# Patient Record
Sex: Female | Born: 1955
Health system: Southern US, Community
[De-identification: ages and names within clinical notes are randomized; demographics above are authoritative.]

## PROBLEM LIST (undated history)

## (undated) DIAGNOSIS — J45909 Unspecified asthma, uncomplicated: Secondary | ICD-10-CM

## (undated) DIAGNOSIS — K219 Gastro-esophageal reflux disease without esophagitis: Secondary | ICD-10-CM

## (undated) DIAGNOSIS — E041 Nontoxic single thyroid nodule: Secondary | ICD-10-CM

## (undated) DIAGNOSIS — I10 Essential (primary) hypertension: Secondary | ICD-10-CM

## (undated) DIAGNOSIS — K635 Polyp of colon: Secondary | ICD-10-CM

## (undated) HISTORY — PX: ABDOMINAL HYSTERECTOMY: SHX81

## (undated) HISTORY — DX: Gastro-esophageal reflux disease without esophagitis: K21.9

## (undated) HISTORY — PX: THYROIDECTOMY, PARTIAL: SHX18

## (undated) HISTORY — DX: Nontoxic single thyroid nodule: E04.1

## (undated) HISTORY — DX: Polyp of colon: K63.5

## (undated) HISTORY — DX: Unspecified asthma, uncomplicated: J45.909

## (undated) HISTORY — DX: Essential (primary) hypertension: I10

---

## 2013-10-22 ENCOUNTER — Ambulatory Visit (INDEPENDENT_AMBULATORY_CARE_PROVIDER_SITE_OTHER): Payer: BC Managed Care – PPO | Admitting: Family Medicine

## 2013-10-22 VITALS — BP 148/90 | HR 71 | Temp 98.3°F | Resp 18

## 2013-10-22 DIAGNOSIS — R0789 Other chest pain: Secondary | ICD-10-CM

## 2013-10-22 DIAGNOSIS — K625 Hemorrhage of anus and rectum: Secondary | ICD-10-CM

## 2013-10-22 DIAGNOSIS — R42 Dizziness and giddiness: Secondary | ICD-10-CM

## 2013-10-22 LAB — CBC WITH DIFFERENTIAL/PLATELET
Basophils Absolute: 0 10*3/uL (ref 0.0–0.1)
Basophils Relative: 1 % (ref 0–1)
Eosinophils Absolute: 0.1 10*3/uL (ref 0.0–0.7)
Eosinophils Relative: 2 % (ref 0–5)
HCT: 39.3 % (ref 36.0–46.0)
Hemoglobin: 13.2 g/dL (ref 12.0–15.0)
Lymphocytes Relative: 48 % — ABNORMAL HIGH (ref 12–46)
Lymphs Abs: 1.9 10*3/uL (ref 0.7–4.0)
MCH: 28.4 pg (ref 26.0–34.0)
MCHC: 33.6 g/dL (ref 30.0–36.0)
MCV: 84.5 fL (ref 78.0–100.0)
Monocytes Absolute: 0.3 10*3/uL (ref 0.1–1.0)
Monocytes Relative: 8 % (ref 3–12)
Neutro Abs: 1.6 10*3/uL — ABNORMAL LOW (ref 1.7–7.7)
Neutrophils Relative %: 41 % — ABNORMAL LOW (ref 43–77)
Platelets: 251 10*3/uL (ref 150–400)
RBC: 4.65 MIL/uL (ref 3.87–5.11)
RDW: 13.9 % (ref 11.5–15.5)
WBC: 3.9 10*3/uL — ABNORMAL LOW (ref 4.0–10.5)

## 2013-10-22 LAB — COMPREHENSIVE METABOLIC PANEL
ALT: 32 U/L (ref 0–35)
AST: 25 U/L (ref 0–37)
Albumin: 4.6 g/dL (ref 3.5–5.2)
Alkaline Phosphatase: 85 U/L (ref 39–117)
BUN: 14 mg/dL (ref 6–23)
CO2: 27 mEq/L (ref 19–32)
Calcium: 9.9 mg/dL (ref 8.4–10.5)
Chloride: 105 mEq/L (ref 96–112)
Creat: 0.86 mg/dL (ref 0.50–1.10)
Glucose, Bld: 100 mg/dL — ABNORMAL HIGH (ref 70–99)
Potassium: 4.3 mEq/L (ref 3.5–5.3)
Sodium: 141 mEq/L (ref 135–145)
Total Bilirubin: 0.7 mg/dL (ref 0.2–1.2)
Total Protein: 7.9 g/dL (ref 6.0–8.3)

## 2013-10-22 MED ORDER — MECLIZINE HCL 25 MG PO TABS: 25.0000 mg | ORAL_TABLET | Freq: Three times a day (TID) | ORAL | Status: DC | PRN

## 2013-10-22 NOTE — Patient Instructions (Signed)
Please use the meclizine as directed for dizziness- this may make you sleepy We will call you within the next day or two with you lab results We will call you with an appointment with a cardiologist If you feel worse, please return or go to the emergency room  Benign Positional Vertigo Vertigo means you feel like you or your surroundings are moving when they are not. Benign positional vertigo is the most common form of vertigo. Benign means that the cause of your condition is not serious. Benign positional vertigo is more common in older adults. CAUSES  Benign positional vertigo is the result of an upset in the labyrinth system. This is an area in the middle ear that helps control your balance. This may be caused by a viral infection, head injury, or repetitive motion. However, often no specific cause is found. SYMPTOMS  Symptoms of benign positional vertigo occur when you move your head or eyes in different directions. Some of the symptoms may include:  Loss of balance and falls.  Vomiting.  Blurred vision.  Dizziness.  Nausea.  Involuntary eye movements (nystagmus). DIAGNOSIS  Benign positional vertigo is usually diagnosed by physical exam. If the specific cause of your benign positional vertigo is unknown, your caregiver may perform imaging tests, such as magnetic resonance imaging (MRI) or computed tomography (CT). TREATMENT  Your caregiver may recommend movements or procedures to correct the benign positional vertigo. Medicines such as meclizine, benzodiazepines, and medicines for nausea may be used to treat your symptoms. In rare cases, if your symptoms are caused by certain conditions that affect the inner ear, you may need surgery. HOME CARE INSTRUCTIONS   Follow your caregiver's instructions.  Move slowly. Do not make sudden body or head movements.  Avoid driving.  Avoid operating heavy machinery.  Avoid performing any tasks that would be dangerous to you or others  during a vertigo episode.  Drink enough fluids to keep your urine clear or pale yellow. SEEK IMMEDIATE MEDICAL CARE IF:   You develop problems with walking, weakness, numbness, or using your arms, hands, or legs.  You have difficulty speaking.  You develop severe headaches.  Your nausea or vomiting continues or gets worse.  You develop visual changes.  Your family or friends notice any behavioral changes.  Your condition gets worse.  You have a fever.  You develop a stiff neck or sensitivity to light. MAKE SURE YOU:   Understand these instructions.  Will watch your condition.  Will get help right away if you are not doing well or get worse. Document Released: 11/30/2005 Document Revised: 05/17/2011 Document Reviewed: 11/12/2010 [facility] Patient Information 2015 [place]. This information is not intended to replace advice given to you by your health care provider. Make sure you discuss any questions you have with your health care provider.

## 2013-10-22 NOTE — Progress Notes (Signed)
Subjective:    Patient ID: Kerri Johnson, female    DOB: Nov 05, 1955, 58 y.o.   MRN: [id number]  HPI This is a very pleasant 58 yo female who [place] [facility] [place] husband's [place]. Her husband is with her this morning. Patient reports that she has been feeling dizzy for about a week. She is also feeling sweaty with the dizziness. She feels like her heart is thumping this morning. She is having "tiny pings" of pain under her left breast. She has had this pain in the past. Was seen in the ED a couple of months ago for this pain; had a "battery of tests," and was treated for GERD. She also is having numbness in left 2nd and 3rd fingers. She was previously diagnosed with Raynaud's phenomenon. Yesterday, her blood pressure was 148/96. [facility] was [facility] [employer] [facility] and she felt really dizzy and was sweating profusely. She continued to be dizzy all night, worsening when turning her head to the right side, less so when turning to the left. No spinning sensation, feels like she is going to faint. Was diagnosed with benign positional vertigo in the past and has been doing the exercises without significant relief.  She had a negative cardiac cath about 4 years ago. Had a stress test about 3 years ago that was normal. Took lisinopril for awhile, but was unable to tolerate. Her high blood pressure was felt to be related to headaches. She has not since had high blood pressure as long as she controls her headaches.   She reports she has early onset COPD. She has never smoked.  Has occasional GERD. Has lost over 20 pounds by cutting out added sugar.   She has a history of colon polyps. She had a large polyp removed last year. Her mother had a history of multiple polyps. She reports that she has had 2 episodes of blood on the toilet paper when wiping after walking on the treadmill. It only happened following exercise.   She also reports having a small bump on the back of  her head that has been there for 102 months and has gotten progressively larger.  Past Medical History  Diagnosis Date  . Asthma    Past Surgical History  Procedure Laterality Date  . Cesarean section    . Abdominal hysterectomy    . Thyroidectomy, partial     Family History  Problem Relation Age of Onset  . Diabetes Mother   . Heart disease Mother   . Hypertension Mother   . Diabetes Sister   . Hypertension Sister   . Diabetes Sister   - Her mother died from CHF in her 7770's. No known MI, had history of HTN, DM.  History  Substance Use Topics  . Smoking status: Never Smoker   . Smokeless tobacco: Not on file  . Alcohol Use: No     Review of Systems No SOB, no fever, a little nausea several days ago, no runny nose, no ear pain, no sore throat. No polyuria, no polydipsia, no polyphagia. No headache.    Objective:   Physical Exam  Vitals reviewed. Constitutional: She is oriented to person, place, and time. She appears well-developed and well-nourished.  HENT:  Head: Normocephalic and atraumatic.    Right Ear: Tympanic membrane, external ear and ear canal normal.  Left Ear: Tympanic membrane, external ear and ear canal normal.  Nose: Nose normal.  Mouth/Throat: Oropharynx is clear and moist.  Eyes: Conjunctivae and EOM are normal. Pupils are equal, round, and reactive to light. Right eye exhibits no discharge. Left eye exhibits no discharge. No scleral icterus.  Neck: Normal range of motion. Neck supple. No JVD present.  Cardiovascular: Normal rate, regular rhythm, normal heart sounds and intact distal pulses.   Pulmonary/Chest: Effort normal and breath sounds normal. She exhibits no tenderness.  Abdominal: Soft. Bowel sounds are normal.  Genitourinary: Rectum normal. Rectal exam shows no external hemorrhoid.  Musculoskeletal: Normal range of motion. She exhibits edema (trace pretibial edema).  Lymphadenopathy:    She has no cervical adenopathy.  Neurological: She is  alert and oriented to person, place, and time.  Skin: Skin is warm and dry.  Psychiatric: She has a normal mood and affect. Her behavior is normal. Judgment and thought content normal.   EKG reviewed with Dr. Katrinka Blazing- no acute abnormalities    Assessment & Plan:  1. Other chest pain - EKG 12-Lead - CBC with Differential - Comprehensive metabolic panel - Ambulatory referral to Cardiology  2. Dizziness - CBC with Differential - Comprehensive metabolic panel - meclizine (ANTIVERT) 25 MG tablet; Take 1 tablet (25 mg total) by mouth 3 (three) times daily as needed for dizziness.  Dispense: 30 tablet; Refill: 0  3. Rectal bleeding -these seem like isolated episodes- with patient's history or large polyp removed and unknown colonoscopy follow up will schedule with GI. - Ambulatory referral to Gastroenterology  -Patient was instructed to RTC or go to ER if worsening symptoms   Emi Belfast, FNP-BC  [facility], [facility]  10/22/2013 1:00 PM

## 2013-10-24 ENCOUNTER — Encounter: Payer: Self-pay | Admitting: Internal Medicine

## 2013-11-03 NOTE — Progress Notes (Signed)
History and physical examinations reviewed in detail. EKG reviewed.  Agree with assessment and plan.

## 2013-11-16 ENCOUNTER — Encounter: Payer: Self-pay | Admitting: Internal Medicine

## 2013-11-16 ENCOUNTER — Other Ambulatory Visit (INDEPENDENT_AMBULATORY_CARE_PROVIDER_SITE_OTHER): Payer: BC Managed Care – PPO

## 2013-11-16 ENCOUNTER — Ambulatory Visit (INDEPENDENT_AMBULATORY_CARE_PROVIDER_SITE_OTHER): Payer: BC Managed Care – PPO | Admitting: Internal Medicine

## 2013-11-16 VITALS — BP 114/78 | HR 64 | Wt 222.8 lb

## 2013-11-16 DIAGNOSIS — K625 Hemorrhage of anus and rectum: Secondary | ICD-10-CM

## 2013-11-16 DIAGNOSIS — K219 Gastro-esophageal reflux disease without esophagitis: Secondary | ICD-10-CM

## 2013-11-16 DIAGNOSIS — Z860100 Personal history of colon polyps, unspecified: Secondary | ICD-10-CM

## 2013-11-16 DIAGNOSIS — I10 Essential (primary) hypertension: Secondary | ICD-10-CM | POA: Insufficient documentation

## 2013-11-16 LAB — CBC WITH DIFFERENTIAL/PLATELET
Basophils Absolute: 0.1 10*3/uL (ref 0.0–0.1)
Basophils Relative: 1.2 % (ref 0.0–3.0)
Eosinophils Absolute: 0.1 10*3/uL (ref 0.0–0.7)
Eosinophils Relative: 1.2 % (ref 0.0–5.0)
HCT: 41.9 % (ref 36.0–46.0)
Hemoglobin: 13.7 g/dL (ref 12.0–15.0)
Lymphocytes Relative: 45.9 % (ref 12.0–46.0)
Lymphs Abs: 2.3 10*3/uL (ref 0.7–4.0)
MCHC: 32.7 g/dL (ref 30.0–36.0)
MCV: 88.1 fl (ref 78.0–100.0)
Monocytes Absolute: 0.4 10*3/uL (ref 0.1–1.0)
Monocytes Relative: 8.4 % (ref 3.0–12.0)
Neutro Abs: 2.2 10*3/uL (ref 1.4–7.7)
Neutrophils Relative %: 43.3 % (ref 43.0–77.0)
Platelets: 225 10*3/uL (ref 150.0–400.0)
RBC: 4.76 Mil/uL (ref 3.87–5.11)
RDW: 13.5 % (ref 11.5–15.5)
WBC: 5 10*3/uL (ref 4.0–10.5)

## 2013-11-16 MED ORDER — HYDROCORTISONE ACETATE 25 MG RE SUPP: 25.0000 mg | Freq: Two times a day (BID) | RECTAL | Status: DC

## 2013-11-16 MED ORDER — MOVIPREP 100 G PO SOLR: 1.0000 | Freq: Once | ORAL | Status: DC

## 2013-11-16 NOTE — Patient Instructions (Signed)
You have been scheduled for a colonoscopy. Please follow written instructions given to you at your visit today.  Please pick up your prep kit at the pharmacy within the next 1-3 days. If you use inhalers (even only as needed), please bring them with you on the day of your procedure. Your physician has requested that you go to www.startemmi.com and enter the access code given to you at your visit today. This web site gives a general overview about your procedure. However, you should still follow specific instructions given to you by our office regarding your preparation for the procedure.  Your physician has requested that you go to the basement for the following lab work before leaving today:  CBC  We have sent the following medications to your pharmacy for you to pick up at your convenience: Anusol [facility] Suppositories  Continue your Omeprazole daily

## 2013-11-16 NOTE — Progress Notes (Signed)
Patient ID: Kerri Johnson, female   DOB: 08-14-1955, 58 y.o.   MRN: [id number] HPI: Kerri Johnson is a 58 year old female with past medical history of adenomatous colon polyps, hypertension, GERD and mild asthma who is seen in consultation at the request of Silva Bandy, FNP to evaluate rectal bleeding. She is here alone today. She reports that she moved to [place] from [facility] [place] months ago. She's also recently started exercising. Approximately 10 days ago after what she considers vigorous exercise, she reports bright red rectal bleeding. This was not associated with bowel movement. It was associated with rectal discomfort and burning. It occurred 2 days later again after exercise and again not associated with bowel movement. Intervening bowel movements have been unremarkable without blood or melena. Her bowel movements have always been erratic and can be hard and at other times loose. She denies abdominal pain. No tenesmus.  She has a history of colon polyps and her last colonoscopy was 18 months ago performed [facility]. Reportedly she had a large right-sided colon polyp removed which she was told was precancerous. Per her recollection repeat colonoscopy was indicated in one year and she feels she is due at this time for repeat surveillance colonoscopy. She does have a history of reflux disease particular heartburn. She has been trying to lose weight in order to help with her reflux. She's lost about 12 pounds intentionally by drastically reducing sugar intake and also exercise. She denies dysphagia or odynophagia. Neuro satiety, nausea or vomiting. No hepatobiliary complaints.  Past Medical History  Diagnosis Date  . Asthma   . GERD (gastroesophageal reflux disease)   . Colon polyps   . Hypertension     Past Surgical History  Procedure Laterality Date  . Cesarean section    . Abdominal hysterectomy    . Thyroidectomy, partial      Outpatient  Prescriptions Prior to Visit  Medication Sig Dispense Refill  . albuterol (PROVENTIL HFA;VENTOLIN HFA) 108 (90 BASE) MCG/ACT inhaler Inhale into the lungs every 6 (six) hours as needed for wheezing or shortness of breath.      . meclizine (ANTIVERT) 25 MG tablet Take 1 tablet (25 mg total) by mouth 3 (three) times daily as needed for dizziness.  30 tablet  0  . tiotropium (SPIRIVA) 18 MCG inhalation capsule Place 18 mcg into inhaler and inhale daily.       No facility-administered medications prior to visit.    Allergies  Allergen Reactions  . Peanuts [Peanut Oil]   . Shellfish Allergy   . Versed [Midazolam]     Family History  Problem Relation Age of Onset  . Diabetes Mother   . Heart disease Mother   . Hypertension Mother   . Diabetes Sister   . Hypertension Sister   . Diabetes Sister     History  Substance Use Topics  . Smoking status: Never Smoker   . Smokeless tobacco: Never Used  . Alcohol Use: No    ROS: As per history of present illness, otherwise negative  BP 114/78  Pulse 64  Wt 222 lb 12.8 oz (101.061 kg) Constitutional: Well-developed and well-nourished. No distress. HEENT: Normocephalic and atraumatic. Oropharynx is clear and moist. No oropharyngeal exudate. Conjunctivae are normal.  No scleral icterus. Neck: Neck supple. Trachea midline. Cardiovascular: Normal rate, regular rhythm and intact distal pulses. No M/R/G Pulmonary/chest: Effort normal and breath sounds normal. No wheezing, rales or rhonchi. Abdominal: Soft, nontender, nondistended. Bowel sounds active throughout. There are  no masses palpable. No hepatosplenomegaly. Extremities: no clubbing, cyanosis, or edema Neurological: Alert and oriented to person place and time. Skin: Skin is warm and dry. No rashes noted. Psychiatric: Normal mood and affect. Behavior is normal.  RELEVANT LABS AND IMAGING: CBC    Component Value Date/Time   WBC 3.9* 10/22/2013 0958   RBC 4.65 10/22/2013 0958   HGB  13.2 10/22/2013 0958   HCT 39.3 10/22/2013 0958   PLT 251 10/22/2013 0958   MCV 84.5 10/22/2013 0958   MCH 28.4 10/22/2013 0958   MCHC 33.6 10/22/2013 0958   RDW 13.9 10/22/2013 0958   LYMPHSABS 1.9 10/22/2013 0958   MONOABS 0.3 10/22/2013 0958   EOSABS 0.1 10/22/2013 0958   BASOSABS 0.0 10/22/2013 0958    CMP     Component Value Date/Time   NA 141 10/22/2013 0958   K 4.3 10/22/2013 0958   CL 105 10/22/2013 0958   CO2 27 10/22/2013 0958   GLUCOSE 100* 10/22/2013 0958   BUN 14 10/22/2013 0958   CREATININE 0.86 10/22/2013 0958   CALCIUM 9.9 10/22/2013 0958   PROT 7.9 10/22/2013 0958   ALBUMIN 4.6 10/22/2013 0958   AST 25 10/22/2013 0958   ALT 32 10/22/2013 0958   ALKPHOS 85 10/22/2013 0958   BILITOT 0.7 10/22/2013 0958    ASSESSMENT/PLAN:  58 year old female with past medical history of adenomatous colon polyps, hypertension, GERD and mild asthma who is seen in consultation at the request of Silva Bandy, FNP to evaluate rectal bleeding.  1. Rectal bleeding -- her rectal bleeding occurred 2 times after exercise, raising the possibility of hemorrhoidal bleeding. She has not felt a bulge or pain with wiping making internal hemorrhoids more likely than external. I will treat with hydrocortisone suppository 25 mg twice daily for 5 days for internal hemorrhoids. She is also due for colonoscopy which will allow for further evaluation of recent bleeding and possibly hemorrhoids, see #2. Repeat CBC today for comparison given recent bleeding. Last CBC 10/22/2013 revealed hemoglobin of 13.2 (normal).  2. History of adenomatous colon polyp -- she was told to repeat colonoscopy in one year which suggests piecemeal resection or large polyp. We will request records today for review prior to colonoscopy. Colonoscopy was discussed including the risks and benefits and she is agreeable to proceed.  3. GERD -- GERD diet, further weight loss will likely improve symptoms. For now she will take omeprazole 20 mg a more  regular, daily basis to help control symptoms. Her symptoms of heartburn and been mild without alarm symptoms.  Further recommendations after review of records an upcoming colonoscopy

## 2013-11-19 ENCOUNTER — Ambulatory Visit (INDEPENDENT_AMBULATORY_CARE_PROVIDER_SITE_OTHER): Payer: BC Managed Care – PPO | Admitting: Family Medicine

## 2013-11-19 VITALS — BP 132/80 | HR 95 | Temp 98.9°F | Resp 18 | Ht 66.0 in | Wt 221.0 lb

## 2013-11-19 DIAGNOSIS — B349 Viral infection, unspecified: Secondary | ICD-10-CM

## 2013-11-19 DIAGNOSIS — B9789 Other viral agents as the cause of diseases classified elsewhere: Secondary | ICD-10-CM

## 2013-11-19 DIAGNOSIS — J029 Acute pharyngitis, unspecified: Secondary | ICD-10-CM

## 2013-11-19 LAB — POCT RAPID STREP A (OFFICE): Rapid Strep A Screen: NEGATIVE

## 2013-11-19 MED ORDER — FIRST-DUKES MOUTHWASH MT SUSP: 5.0000 mL | OROMUCOSAL | Status: DC | PRN

## 2013-11-19 NOTE — Progress Notes (Signed)
   Subjective:    Patient ID: Kerri Johnson, female    DOB: 1955/12/23, 58 y.o.   MRN: [id number]  HPI Patient presents today with 4 day history of weakness, headaches, sore throat. Has been taking advil 2 tablets every 4 hours with good relief. Had fever last night- subjective, didn't take.  No asthma symptoms. No sick contacts.   Review of Systems Fever, body aches, weakness, occasional cough, right ear ache, No wheeze, no SOB.    Objective:   Physical Exam  Constitutional: She is oriented to person, place, and time. She appears well-developed and well-nourished. She appears ill.  HENT:  Head: Normocephalic and atraumatic.  Right Ear: Tympanic membrane, external ear and ear canal normal.  Left Ear: Tympanic membrane, external ear and ear canal normal.  Nose: Nose normal.  Mouth/Throat: Uvula is midline and mucous membranes are normal. Posterior oropharyngeal erythema present. No oropharyngeal exudate, posterior oropharyngeal edema or tonsillar abscesses.  Eyes: Conjunctivae are normal. Pupils are equal, round, and reactive to light.  Neck: Normal range of motion. Neck supple.  Cardiovascular: Normal rate and regular rhythm.   Pulmonary/Chest: Effort normal and breath sounds normal.  Neurological: She is alert and oriented to person, place, and time.  Skin: Skin is warm and dry.  Psychiatric: She has a normal mood and affect. Her behavior is normal. Judgment and thought content normal.   Rapid strep- negative     Assessment & Plan:  1. Pharyngitis - POCT rapid strep A- negative - Culture, Group A Strep  2. Viral illness -Provided written and verbal information regarding diagnosis and treatment. -symptomatic treatment, RTC if no improvement in 3-4 days, sooner if worseing.   Emi Belfast, FNP-BC  [facility], [facility]  11/20/2013 9:57 PM

## 2013-11-19 NOTE — Patient Instructions (Signed)
Try to take ibuprofen 3 tablets every 6-8 hours Increase fluids I have sent a numbing medication to your pharmacy, you can also try warm salt water gargles and warm beverages for comfort. Please come back in if you are feeling worse in the next couple of days or if you are not improved in 3-4 days.   Pharyngitis Pharyngitis is redness, pain, and swelling (inflammation) of your pharynx.  CAUSES  Pharyngitis is usually caused by infection. Most of the time, these infections are from viruses (viral) and are part of a cold. However, sometimes pharyngitis is caused by bacteria (bacterial). Pharyngitis can also be caused by allergies. Viral pharyngitis may be spread from person to person by coughing, sneezing, and personal items or utensils (cups, forks, spoons, toothbrushes). Bacterial pharyngitis may be spread from person to person by more intimate contact, such as kissing.  SIGNS AND SYMPTOMS  Symptoms of pharyngitis include:   Sore throat.   Tiredness (fatigue).   Low-grade fever.   Headache.  Joint pain and muscle aches.  Skin rashes.  Swollen lymph nodes.  Plaque-like film on throat or tonsils (often seen with bacterial pharyngitis). DIAGNOSIS  Your health care provider will ask you questions about your illness and your symptoms. Your medical history, along with a physical exam, is often all that is needed to diagnose pharyngitis. Sometimes, a rapid strep test is done. Other lab tests may also be done, depending on the suspected cause.  TREATMENT  Viral pharyngitis will usually get better in 3-4 days without the use of medicine. Bacterial pharyngitis is treated with medicines that kill germs (antibiotics).  HOME CARE INSTRUCTIONS   Drink enough water and fluids to keep your urine clear or pale yellow.   Only take over-the-counter or prescription medicines as directed by your health care provider:   If you are prescribed antibiotics, make sure you finish them even if you  start to feel better.   Do not take aspirin.   Get lots of rest.   Gargle with 8 oz of salt water ( tsp of salt per 1 qt of water) as often as every 1-2 hours to soothe your throat.   Throat lozenges (if you are not at risk for choking) or sprays may be used to soothe your throat. SEEK MEDICAL CARE IF:   You have large, tender lumps in your neck.  You have a rash.  You cough up green, yellow-brown, or bloody spit. SEEK IMMEDIATE MEDICAL CARE IF:   Your neck becomes stiff.  You drool or are unable to swallow liquids.  You vomit or are unable to keep medicines or liquids down.  You have severe pain that does not go away with the use of recommended medicines.  You have trouble breathing (not caused by a stuffy nose). MAKE SURE YOU:   Understand these instructions.  Will watch your condition.  Will get help right away if you are not doing well or get worse. Document Released: 02/22/2005 Document Revised: 12/13/2012 Document Reviewed: 10/30/2012 [facility] Patient Information 2015 [place], [place]. This information is not intended to replace advice given to you by your health care provider. Make sure you discuss any questions you have with your health care provider.

## 2013-11-20 LAB — CULTURE, GROUP A STREP

## 2013-11-21 ENCOUNTER — Telehealth: Payer: Self-pay

## 2013-11-21 MED ORDER — AMOXICILLIN 875 MG PO TABS: 875.0000 mg | ORAL_TABLET | Freq: Two times a day (BID) | ORAL | Status: AC

## 2013-11-21 NOTE — Telephone Encounter (Signed)
Rx sent.  Meds ordered this encounter  Medications  . amoxicillin (AMOXIL) 875 MG tablet    Sig: Take 1 tablet (875 mg total) by mouth 2 (two) times daily.    Dispense:  20 tablet    Refill:  0    Order Specific Question:  Supervising Provider    Answer:  DOOLITTLE, ROBERT P [[id number]]

## 2013-11-21 NOTE — Telephone Encounter (Signed)
Pt called back re: lab result call she received. Reports that she is still not feeling well, in fact her throat is now sore on both sides. She would like an Abx sent in as soon as possible so she can get started on it. See lab results (pos for non strep A).

## 2013-11-23 ENCOUNTER — Encounter: Payer: Self-pay | Admitting: Family Medicine

## 2013-11-23 ENCOUNTER — Ambulatory Visit (INDEPENDENT_AMBULATORY_CARE_PROVIDER_SITE_OTHER): Payer: BC Managed Care – PPO | Admitting: Family Medicine

## 2013-11-23 VITALS — BP 148/94 | HR 90 | Temp 98.2°F | Resp 16 | Ht 65.5 in | Wt 219.2 lb

## 2013-11-23 DIAGNOSIS — Z13220 Encounter for screening for lipoid disorders: Secondary | ICD-10-CM

## 2013-11-23 DIAGNOSIS — Z0000 Encounter for general adult medical examination without abnormal findings: Secondary | ICD-10-CM

## 2013-11-23 DIAGNOSIS — Z130 Encounter for screening for diseases of the blood and blood-forming organs and certain disorders involving the immune mechanism: Secondary | ICD-10-CM

## 2013-11-23 DIAGNOSIS — Z8742 Personal history of other diseases of the female genital tract: Secondary | ICD-10-CM

## 2013-11-23 DIAGNOSIS — E890 Postprocedural hypothyroidism: Secondary | ICD-10-CM

## 2013-11-23 DIAGNOSIS — Z9889 Other specified postprocedural states: Secondary | ICD-10-CM

## 2013-11-23 DIAGNOSIS — Z8639 Personal history of other endocrine, nutritional and metabolic disease: Secondary | ICD-10-CM

## 2013-11-23 DIAGNOSIS — Z1239 Encounter for other screening for malignant neoplasm of breast: Secondary | ICD-10-CM

## 2013-11-23 DIAGNOSIS — Z9009 Acquired absence of other part of head and neck: Secondary | ICD-10-CM

## 2013-11-23 DIAGNOSIS — K219 Gastro-esophageal reflux disease without esophagitis: Secondary | ICD-10-CM

## 2013-11-23 LAB — CBC
HCT: 37.5 % (ref 36.0–46.0)
Hemoglobin: 12.6 g/dL (ref 12.0–15.0)
MCH: 28 pg (ref 26.0–34.0)
MCHC: 33.6 g/dL (ref 30.0–36.0)
MCV: 83.3 fL (ref 78.0–100.0)
Platelets: 259 10*3/uL (ref 150–400)
RBC: 4.5 MIL/uL (ref 3.87–5.11)
RDW: 13.7 % (ref 11.5–15.5)
WBC: 4.8 10*3/uL (ref 4.0–10.5)

## 2013-11-23 LAB — LIPID PANEL
Cholesterol: 150 mg/dL (ref 0–200)
HDL: 44 mg/dL (ref >39)
LDL Cholesterol: 94 mg/dL (ref 0–99)
Total CHOL/HDL Ratio: 3.4 Ratio
Triglycerides: 62 mg/dL (ref <150)
VLDL: 12 mg/dL (ref 0–40)

## 2013-11-23 LAB — COMPLETE METABOLIC PANEL WITH GFR
ALT: 66 U/L — ABNORMAL HIGH (ref 0–35)
AST: 26 U/L (ref 0–37)
Albumin: 4.2 g/dL (ref 3.5–5.2)
Alkaline Phosphatase: 109 U/L (ref 39–117)
BUN: 15 mg/dL (ref 6–23)
CO2: 27 mEq/L (ref 19–32)
Calcium: 9.6 mg/dL (ref 8.4–10.5)
Chloride: 105 mEq/L (ref 96–112)
Creat: 0.81 mg/dL (ref 0.50–1.10)
GFR, Est African American: >89 mL/min
GFR, Est Non African American: 81 mL/min
Glucose, Bld: 101 mg/dL — ABNORMAL HIGH (ref 70–99)
Potassium: 4.6 mEq/L (ref 3.5–5.3)
Sodium: 140 mEq/L (ref 135–145)
Total Bilirubin: 0.7 mg/dL (ref 0.2–1.2)
Total Protein: 7.5 g/dL (ref 6.0–8.3)

## 2013-11-23 LAB — TSH: TSH: 3.647 u[IU]/mL (ref 0.350–4.500)

## 2013-11-23 MED ORDER — OMEPRAZOLE 20 MG PO CPDR: 20.0000 mg | DELAYED_RELEASE_CAPSULE | Freq: Every day | ORAL | Status: DC

## 2013-11-23 NOTE — Progress Notes (Signed)
 Chief Complaint:  Chief Complaint  Patient presents with  . Establish Care  . Medication Reaction    possible reaction to antibiotic itching on hands and lip after second day on medicine    HPI: Kerri Johnson is a 58 y.o. female who is here for establlishing care She has had CPE on her birthday last year, she has had partial hysterectomy for benign fibroids She has "COPD" but not smoker She had a hysterctomy ( partial ) due to fibroids, benign path causing her to bleed heavily She is here from [place], recent transplant to [place] prior to that was living in [place] Last pap was last year PCP Dr Lilia Pro ( [place] [phone]) ,  Last mammogram is overdue She is scheduled for colonoscopy d/t recent rectal bleeding  She is also on amox for GBS pharyngitis and is not sure if she is having a reaction, she states it was after 2 days and she denies any SOB, CP, or throat swelling or rash, she just has some itching on the sides of her mouth She would like to continue with meds and if she ahs any worsening sxs then she will leet me know.   Past Medical History  Diagnosis Date  . Asthma   . GERD (gastroesophageal reflux disease)   . Colon polyps   . Hypertension   . Thyroid nodule     dx 2009   Past Surgical History  Procedure Laterality Date  . Cesarean section    . Abdominal hysterectomy    . Thyroidectomy, partial     History   Social History  . Marital Status: Married    Spouse Name: N/A    Number of Children: 3  . Years of Education: N/A   Social History Main Topics  . Smoking status: Never Smoker   . Smokeless tobacco: Never Used  . Alcohol Use: No  . Drug Use: No  . Sexual Activity: None   Other Topics Concern  . None   Social History Narrative  . None   Family History  Problem Relation Age of Onset  . Diabetes Mother   . Heart disease Mother   . Hypertension Mother   . Diabetes Sister   . Hypertension Sister   .  Diabetes Sister    Allergies  Allergen Reactions  . Shellfish Allergy   . Versed [Midazolam]    Prior to Admission medications   Medication Sig Start Date End Date Taking? Authorizing Provider  albuterol (PROVENTIL HFA;VENTOLIN HFA) 108 (90 BASE) MCG/ACT inhaler Inhale into the lungs every 6 (six) hours as needed for wheezing or shortness of breath.   Yes Historical Provider, MD  amoxicillin (AMOXIL) 875 MG tablet Take 1 tablet (875 mg total) by mouth 2 (two) times daily. 11/21/13 12/01/13 Yes Chelle S Jeffery, PA-C  meclizine (ANTIVERT) 25 MG tablet Take 1 tablet (25 mg total) by mouth 3 (three) times daily as needed for dizziness. 10/22/13  Yes Emi Belfast, FNP  tiotropium (SPIRIVA) 18 MCG inhalation capsule Place 18 mcg into inhaler and inhale daily.   Yes Historical Provider, MD  acetaminophen (TYLENOL) 325 MG tablet Take 650 mg by mouth every 6 (six) hours as needed.    Historical Provider, MD  Diphenhyd-Hydrocort-Nystatin (FIRST-DUKES MOUTHWASH) SUSP Use as directed 5 mLs in the mouth or throat every 2 (two) hours as needed. Drizzle down sides of throat, can swish and swallow or spit out. 11/19/13   Emi Belfast, FNP  hydrocortisone (ANUSOL-HC) 25 MG suppository Place 1 suppository (25 mg total) rectally 2 (two) times daily. 11/16/13   Beverley Fiedler, MD     ROS: The patient denies fevers, chills, night sweats, unintentional weight loss, chest pain, palpitations, wheezing, dyspnea on exertion, nausea, vomiting, abdominal pain, dysuria, hematuria, melena, numbness, weakness, or tingling.   All other systems have been reviewed and were otherwise negative with the exception of those mentioned in the HPI and as above.    PHYSICAL EXAM: Filed Vitals:   11/23/13 0910  BP: 148/94  Pulse: 90  Temp: 98.2 F (36.8 C)  Resp: 16   Filed Vitals:   11/23/13 0910  Height: 5' 5.5" (1.664 m)  Weight: 219 lb 3.2 oz (99.428 kg)   Body mass index is 35.91 kg/(m^2).  General: Alert, no  acute distress HEENT:  Normocephalic, atraumatic, oropharynx patent. EOMI, PERRLA. No obvious rash, throat is clear and no exudates. Tm normal, fundo exam normal Cardiovascular:  Regular rate and rhythm, no rubs murmurs or gallops.  No Carotid bruits, radial pulse intact. No pedal edema.  Respiratory: Clear to auscultation bilaterally.  No wheezes, rales, or rhonchi.  No cyanosis, no use of accessory musculature GI: No organomegaly, abdomen is soft and non-tender, positive bowel sounds.  No masses. Skin: No rashes. Neurologic: Facial musculature symmetric. Psychiatric: Patient is appropriate throughout our interaction. Lymphatic: No cervical lymphadenopathy Musculoskeletal: Gait intact. Breast exam normal Declined pelvic exam today   LABS: Results for orders placed in visit on 11/23/13  COMPLETE METABOLIC PANEL WITH GFR      Result Value Ref Range   Sodium 140  135 - 145 mEq/L   Potassium 4.6  3.5 - 5.3 mEq/L   Chloride 105  96 - 112 mEq/L   CO2 27  19 - 32 mEq/L   Glucose, Bld 101 (*) 70 - 99 mg/dL   BUN 15  6 - 23 mg/dL   Creat 1.61  0.96 - 0.45 mg/dL   Total Bilirubin 0.7  0.2 - 1.2 mg/dL   Alkaline Phosphatase 109  39 - 117 U/L   AST 26  0 - 37 U/L   ALT 66 (*) 0 - 35 U/L   Total Protein 7.5  6.0 - 8.3 g/dL   Albumin 4.2  3.5 - 5.2 g/dL   Calcium 9.6  8.4 - 40.9 mg/dL   GFR, Est African American >89     GFR, Est Non African American 81    CBC      Result Value Ref Range   WBC 4.8  4.0 - 10.5 K/uL   RBC 4.50  3.87 - 5.11 MIL/uL   Hemoglobin 12.6  12.0 - 15.0 g/dL   HCT 81.1  91.4 - 78.2 %   MCV 83.3  78.0 - 100.0 fL   MCH 28.0  26.0 - 34.0 pg   MCHC 33.6  30.0 - 36.0 g/dL   RDW 95.6  21.3 - 08.6 %   Platelets 259  150 - 400 K/uL  TSH      Result Value Ref Range   TSH 3.647  0.350 - 4.500 uIU/mL  LIPID PANEL      Result Value Ref Range   Cholesterol 150  0 - 200 mg/dL   Triglycerides 62  <578 mg/dL   HDL 44  >46 mg/dL   Total CHOL/HDL Ratio 3.4     VLDL 12  0 -  40 mg/dL   LDL Cholesterol 94  0 - 99 mg/dL  VITAMIN  D 25 HYDROXY      Result Value Ref Range   Vit D, 25-Hydroxy 27 (*) 30 - 89 ng/mL     EKG/XRAY:   Primary read interpreted by Dr. Conley Rolls at [facility].   ASSESSMENT/PLAN: Encounter Diagnoses  Name Primary?  . Screening for deficiency anemia   . Screening for breast cancer   . History of partial thyroidectomy   . Screening for hyperlipidemia   . Annual physical exam Yes  . Gastroesophageal reflux disease, esophagitis presence not specified   . H/O estrogen deficiency     Labs pending Will f/u after labs are completed She will be referred for mammogram, she will get her colonoscopy which is already scheduled Decline flu vaccine on this visit, she is going out of town and does not want to feel bad and she also is on amoxacillin for GBS pharyngitis, will monitor for sxs and call me if we need to put her on another abx F/u prn or in 1 year  Gross sideeffects, risk and benefits, and alternatives of medications d/w patient. Patient is aware that all medications have potential sideeffects and we are unable to predict every sideeffect or drug-drug interaction that may occur.  ,  PHUONG, DO 11/27/2013 5:35 AM

## 2013-11-23 NOTE — Telephone Encounter (Signed)
Left detailed msg on machine, call back with any questions

## 2013-11-24 LAB — VITAMIN D 25 HYDROXY (VIT D DEFICIENCY, FRACTURES): Vit D, 25-Hydroxy: 27 ng/mL — ABNORMAL LOW (ref 30–89)

## 2013-11-26 ENCOUNTER — Encounter: Payer: Self-pay | Admitting: Internal Medicine

## 2013-11-28 ENCOUNTER — Encounter: Payer: Self-pay | Admitting: Cardiology

## 2013-11-28 ENCOUNTER — Ambulatory Visit (INDEPENDENT_AMBULATORY_CARE_PROVIDER_SITE_OTHER): Payer: BC Managed Care – PPO | Admitting: Cardiology

## 2013-11-28 VITALS — BP 128/76 | HR 83 | Ht 66.0 in | Wt 219.0 lb

## 2013-11-28 DIAGNOSIS — R079 Chest pain, unspecified: Secondary | ICD-10-CM

## 2013-11-28 DIAGNOSIS — R9431 Abnormal electrocardiogram [ECG] [EKG]: Secondary | ICD-10-CM

## 2013-11-28 DIAGNOSIS — R0789 Other chest pain: Secondary | ICD-10-CM

## 2013-11-28 DIAGNOSIS — I10 Essential (primary) hypertension: Secondary | ICD-10-CM

## 2013-11-28 NOTE — Patient Instructions (Signed)
Your physician recommends that you continue on your current medications as directed. Please refer to the Current Medication list given to you today.  Your physician has requested that you have en exercise stress myoview. For further information please visit [email or web address]. Please follow instruction sheet, as given.  Follow up as needed  

## 2013-11-28 NOTE — Progress Notes (Signed)
Kerri Johnson Date of Birth:  1955/05/14 [facility] [address] [place], [place]  [address] [phone]        Fax   [phone]   History of Present Illness: This pleasant 58 year old [place] American woman is seen by me for the first time today at the request of Dr. Hamilton Capri.  She is being seen for evaluation of chest pain.  She has a past history of chest pain.  She recently moved to [place] from [facility] about 3 months ago.  Her husband is the Psychologist, prison and probation services of [employer].  About 3-4 weeks ago she had precordial chest pain associated with dizziness and went to urgent care where her evaluation was unremarkable.  She reports that in 2006 she had a prior cardiac catheterization in [place] and was told that there were no blockages.  She is a nonsmoker.  She has had prior history of high blood pressure but is not presently on any blood pressure medication.  She denies any knowledge of hypercholesterolemia.  She has a past history of migraine headaches.  She has a history of rectal bleeding and has an appointment to see GI for a colonoscopy in October.  She has been intentionally losing weight since last Halloween.  Over that period of time she has lost 20 pounds by avoiding sugars.  She has a history of asthma which she attributes to exposure to secondhand smoke as a child she has a history of a prior goiter with partial thyroidectomy.  She has a history of previous Raynaud's phenomenon. Her chest pain is precordial.  Occasionally she will have numbness in the hands.  She will have occasional sweats.  The discomfort is not temporally related to exercise.  Normally she walks several times a week for exercise without difficulty.  Current Outpatient Prescriptions  Medication Sig Dispense Refill  . albuterol (PROVENTIL HFA;VENTOLIN HFA) 108 (90 BASE) MCG/ACT inhaler Inhale into the lungs every 6 (six) hours as needed for wheezing or shortness of breath.        Marland Kitchen amoxicillin (AMOXIL) 875 MG tablet Take 1 tablet (875 mg total) by mouth 2 (two) times daily.  20 tablet  0  . meclizine (ANTIVERT) 25 MG tablet Take 1 tablet (25 mg total) by mouth 3 (three) times daily as needed for dizziness.  30 tablet  0  . omeprazole (PRILOSEC) 20 MG capsule Take 1 capsule (20 mg total) by mouth daily.  30 capsule  11  . tiotropium (SPIRIVA) 18 MCG inhalation capsule Place 18 mcg into inhaler and inhale daily.      Marland Kitchen acetaminophen (TYLENOL) 325 MG tablet Take 650 mg by mouth every 6 (six) hours as needed.      . Diphenhyd-Hydrocort-Nystatin (FIRST-DUKES MOUTHWASH) SUSP Use as directed 5 mLs in the mouth or throat every 2 (two) hours as needed. Drizzle down sides of throat, can swish and swallow or spit out.  237 mL  0  . hydrocortisone (ANUSOL-HC) 25 MG suppository Place 1 suppository (25 mg total) rectally 2 (two) times daily.  10 suppository  0   No current facility-administered medications for this visit.    Allergies  Allergen Reactions  . Shellfish Allergy   . Versed [Midazolam]     Patient Active Problem List   Diagnosis Date Noted  . Chest pain of uncertain etiology 11/28/2013  . Personal history of colonic polyps 11/16/2013  . GERD (gastroesophageal reflux disease) 11/16/2013  . HTN (hypertension) 11/16/2013  History  Smoking status  . Never Smoker   Smokeless tobacco  . Never Used    History  Alcohol Use No    Family History  Problem Relation Age of Onset  . Diabetes Mother   . Heart disease Mother   . Hypertension Mother   . Diabetes Sister   . Hypertension Sister   . Diabetes Sister     Review of Systems: Constitutional: no fever chills diaphoresis or fatigue or change in weight.  Head and neck: no hearing loss, no epistaxis, no photophobia or visual disturbance. Respiratory: No cough, shortness of breath or wheezing. Cardiovascular: No chest pain peripheral edema, palpitations. Gastrointestinal: No abdominal distention, no  abdominal pain, no change in bowel habits hematochezia or melena. Genitourinary: No dysuria, no frequency, no urgency, no nocturia. Musculoskeletal:No arthralgias, no back pain, no gait disturbance or myalgias. Neurological: No dizziness, no headaches, no numbness, no seizures, no syncope, no weakness, no tremors. Hematologic: No lymphadenopathy, no easy bruising. Psychiatric: No confusion, no hallucinations, no sleep disturbance.    Physical Exam: Filed Vitals:   11/28/13 0954  BP: 128/76  Pulse: 83  The patient appears to be in no distress.  Head and neck exam reveals that the pupils are equal and reactive.  The extraocular movements are full.  There is no scleral icterus.  Mouth and pharynx are benign.  No lymphadenopathy.  No carotid bruits.  The jugular venous pressure is normal.  Thyroid is not enlarged or tender.  Chest is clear to percussion and auscultation.  No rales or rhonchi.  Expansion of the chest is symmetrical.  Heart reveals no abnormal lift or heave.  First and second heart sounds are normal.  There is no murmur gallop rub or click.  The abdomen is soft and nontender.  Bowel sounds are normoactive.  There is no hepatosplenomegaly or mass.  There are no abdominal bruits.  Extremities reveal no phlebitis or edema.  Pedal pulses are good.  There is no cyanosis or clubbing.  Neurologic exam is normal strength and no lateralizing weakness.  No sensory deficits.  Integument reveals no rash  EKG shows sinus rhythm and possible old anteroseptal myocardial infarction with QS pattern in V1 and V2.  Assessment / Plan: 1. atypical chest pain 2. abnormal EKG 3. past history of migraine headache 4. past history of essential hypertension 5. past history of asthma 6. past history of essential hypertension  Disposition: We will have the patient return for a treadmill Myoview stress test to evaluate her chest pain further.  We will also pay close attention to wall motion of  her anterior wall in view of her resting EKG abnormality. No new medications were prescribed.  We will be in touch regarding the results of her treadmill Myoview. Recheck here when necessary.

## 2013-11-30 ENCOUNTER — Telehealth: Payer: Self-pay

## 2013-11-30 NOTE — Telephone Encounter (Signed)
Pt returning MLOM from Dr.Le concerning her lab results. I was unable to get in touch with clinical at time of call. Had pt leave message with lab, and told her I would also put a message in. Pt is very concerned.

## 2013-11-30 NOTE — Telephone Encounter (Signed)
LM to call me back about labs.  

## 2013-12-10 ENCOUNTER — Encounter (HOSPITAL_COMMUNITY): Payer: BC Managed Care – PPO

## 2013-12-11 ENCOUNTER — Encounter: Payer: Self-pay | Admitting: Family Medicine

## 2013-12-12 ENCOUNTER — Encounter: Payer: Self-pay | Admitting: Internal Medicine

## 2013-12-12 ENCOUNTER — Ambulatory Visit (AMBULATORY_SURGERY_CENTER): Payer: BC Managed Care – PPO | Admitting: Internal Medicine

## 2013-12-12 VITALS — BP 125/78 | HR 68 | Temp 97.1°F | Resp 13 | Ht 66.0 in | Wt 279.0 lb

## 2013-12-12 DIAGNOSIS — Z86010 Personal history of colonic polyps: Secondary | ICD-10-CM

## 2013-12-12 DIAGNOSIS — K625 Hemorrhage of anus and rectum: Secondary | ICD-10-CM

## 2013-12-12 MED ORDER — SODIUM CHLORIDE 0.9 % IV SOLN: 500.0000 mL | INTRAVENOUS | Status: DC

## 2013-12-12 NOTE — Progress Notes (Signed)
A/ox3 pleased with MAC, report to Annette RN 

## 2013-12-12 NOTE — Progress Notes (Signed)
No problems noted in the recovery room. maw 

## 2013-12-12 NOTE — Op Note (Signed)
[facility] [address] [place], [id number]   COLONOSCOPY PROCEDURE REPORT  PATIENT: Kerri BeardsWilkins, [name]  MR#: [id number] BIRTHDATE: Apr 25, 1955 , 58  yrs. old GENDER: female ENDOSCOPIST: Beverley FiedlerJay M Pyrtle, MD REFERRED BY: Deboraha Sprangebbie Gessner, NP PROCEDURE DATE:  12/12/2013 PROCEDURE:   Colonoscopy, surveillance First Screening Colonoscopy - Avg.  risk and is 50 yrs.  old or older - No.  Prior Negative Screening - Now for repeat screening. N/A  History of Adenoma - Now for follow-up colonoscopy & has been > or = to 3 yrs.  N/A  Polyps Removed Today? No.  Recommend repeat exam, <10 yrs? Polyps Removed Today? No.  Recommend repeat exam, <10 yrs? Yes.  Polyps Removed Today? No.  Recommend repeat exam, <10 yrs? Yes.  High risk (family or personal hx). ASA CLASS:   Class II INDICATIONS:high risk personal history of colonic polyps (sessile serrated adenoma 2013) and rectal bleeding. MEDICATIONS: Monitored anesthesia care and Propofol 320 mg IV  DESCRIPTION OF PROCEDURE:   After the risks benefits and alternatives of the procedure were thoroughly explained, informed consent was obtained.  The digital rectal exam revealed no abnormalities of the rectum.   The LB [id number]-[id number]  endoscope was introduced through the anus and advanced to the cecum, which was identified by both the appendix and ileocecal valve. No adverse events experienced.   The quality of the prep was good, using MoviPrep  The instrument was then slowly withdrawn as the colon was fully examined.    COLON FINDINGS: Redundant colon. A normal appearing cecum, ileocecal valve, and appendiceal orifice were identified.  The ascending, transverse, descending, sigmoid colon, and rectum appeared unremarkable.  Retroflexed views revealed no abnormalities. The time to cecum=8 minutes 02 seconds.  Withdrawal time=12 minutes 00 seconds.  The scope was withdrawn and the procedure completed.  COMPLICATIONS: There were no  immediate complications.  ENDOSCOPIC IMPRESSION: Normal colonoscopy; resolved rectal bleeding likely the result of resolved internal hemorrhoids (treated with suppository)  RECOMMENDATIONS: Repeat Colonoscopy in 5 years.  eSigned:  Beverley FiedlerJay M Pyrtle, MD 12/12/2013 3:14 PM   cc: The Patient

## 2013-12-12 NOTE — Patient Instructions (Signed)

## 2013-12-13 ENCOUNTER — Telehealth: Payer: Self-pay | Admitting: *Deleted

## 2013-12-13 NOTE — Telephone Encounter (Signed)
No answer. Name identifier. Message left to call if questions or concers.

## 2013-12-20 ENCOUNTER — Ambulatory Visit (HOSPITAL_COMMUNITY): Payer: BC Managed Care – PPO | Attending: Cardiology | Admitting: Radiology

## 2013-12-20 VITALS — BP 123/92 | HR 68 | Ht 67.0 in | Wt 218.0 lb

## 2013-12-20 DIAGNOSIS — R42 Dizziness and giddiness: Secondary | ICD-10-CM | POA: Diagnosis not present

## 2013-12-20 DIAGNOSIS — R002 Palpitations: Secondary | ICD-10-CM | POA: Diagnosis not present

## 2013-12-20 DIAGNOSIS — R0789 Other chest pain: Secondary | ICD-10-CM

## 2013-12-20 DIAGNOSIS — R9431 Abnormal electrocardiogram [ECG] [EKG]: Secondary | ICD-10-CM

## 2013-12-20 DIAGNOSIS — I10 Essential (primary) hypertension: Secondary | ICD-10-CM | POA: Insufficient documentation

## 2013-12-20 DIAGNOSIS — R079 Chest pain, unspecified: Secondary | ICD-10-CM | POA: Insufficient documentation

## 2013-12-20 MED ORDER — TECHNETIUM TC 99M SESTAMIBI GENERIC - CARDIOLITE
33.0000 | Freq: Once | INTRAVENOUS | Status: AC | PRN
Start: 2013-12-20 — End: 2013-12-20
  Administered 2013-12-20: 33 via INTRAVENOUS

## 2013-12-20 MED ORDER — TECHNETIUM TC 99M SESTAMIBI GENERIC - CARDIOLITE
11.0000 | Freq: Once | INTRAVENOUS | Status: AC | PRN
  Administered 2013-12-20: 11 via INTRAVENOUS

## 2013-12-20 NOTE — Progress Notes (Signed)
[facility] SITE 3 NUCLEAR MED [address] [place], [place] [address] [phone]    Cardiology Nuclear Med Study  Kerri Johnson is a 58 y.o. female     MRN : [id number]     DOB: June 15, 1955  Procedure Date: 12/20/2013  Nuclear Med Background Indication for Stress Test:  Evaluation for Ischemia History:  ?MPI in [place], Asthma Cardiac Risk Factors: Hypertension  Symptoms:  Chest Pain (last date of chest discomfort was two months ago), Dizziness and Palpitations  Nuclear Pre-Procedure Caffeine/Decaff Intake:  None NPO After: 7:00pm   Lungs:  clear O2 Sat: 98% on room air. IV 0.9% NS with Angio Cath:  22g  IV Site: R Hand  IV Started by:  Bonnita LevanJackie Smith, RN  Chest Size (in):  38 Cup Size: C  Height: 5\' 7"  (1.702 m)  Weight:  218 lb (98.884 kg)  BMI:  Body mass index is 34.14 kg/(m^2). Tech Comments:  N/A    Nuclear Med Study 1 or 2 day study: 1 day  Stress Test Type:  Stress  Reading MD: N/A  Order Authorizing Provider:  Cassell Clementhomas Brackbill, MD  Resting Radionuclide: Technetium 6134m Sestamibi  Resting Radionuclide Dose: 11.0 mCi   Stress Radionuclide:  Technetium 3234m Sestamibi  Stress Radionuclide Dose: 33.0 mCi           Stress Protocol Rest HR: 68 Stress HR: 155  Rest BP: 123/92 Stress BP: 196/95  Exercise Time (min): 6:00 METS: 7.0           Dose of Adenosine (mg):  n/a Dose of Lexiscan: n/a mg  Dose of Atropine (mg): n/a Dose of Dobutamine: n/a mcg/kg/min (at max HR)  Stress Test Technologist: Para Cossey ChimesSharon Brooks, BS-ES  Nuclear Technologist:  Kerby NoraElzbieta Kubak, CNMT     Rest Procedure:  Myocardial perfusion imaging was performed at rest 45 minutes following the intravenous administration of Technetium 10534m Sestamibi. Rest ECG: NSR - Normal EKG  Stress Procedure:  The patient exercised on the treadmill utilizing the Bruce Protocol for 6:00 minutes. The patient stopped due to fatigue and denied any chest pain.  Technetium 6034m Sestamibi was injected at peak  exercise and myocardial perfusion imaging was performed after a brief delay. Stress ECG: SR, 1 mm upsloaping ST depression in the inferolateral leads, that resolve 30 seconds into recovery.  QPS Raw Data Images:  Normal; no motion artifact; normal heart/lung ratio. Stress Images:  Normal homogeneous uptake in all areas of the myocardium. Rest Images:  Normal homogeneous uptake in all areas of the myocardium. Subtraction (SDS):  No evidence of ischemia. Transient Ischemic Dilatation (Normal <1.22):  0.73 Lung/Heart Ratio (Normal <0.45):  0.26  Quantitative Gated Spect Images QGS EDV:  62 ml QGS ESV:  19 ml  Impression Exercise Capacity:  Good exercise capacity. BP Response:  Hypertensive blood pressure response. Clinical Symptoms:  No chest pain. ECG Impression:  No significant ST segment change suggestive of ischemia. Comparison with Prior Nuclear Study: No images to compare  Overall Impression:  Normal stress nuclear study.  LV Ejection Fraction: 70%.  LV Wall Motion:  NL LV Function; NL Wall Motion    Lars MassonELSON, Kerri Johnson 12/20/2013

## 2013-12-26 ENCOUNTER — Telehealth: Payer: Self-pay | Admitting: Cardiology

## 2013-12-26 NOTE — Telephone Encounter (Signed)
Advised patient and will forward to PCP  

## 2013-12-26 NOTE — Telephone Encounter (Signed)
New message ° ° °Patient returning call back to nurse.  °

## 2013-12-26 NOTE — Telephone Encounter (Signed)
Message copied by Burnell BlanksPRATT, Kidada Ging B on Wed Dec 26, 2013  3:46 PM ------      Message from: Cassell ClementBRACKBILL, THOMAS      Created: Mon Dec 24, 2013  7:40 AM       Please report.  The Myoview stress test was normal.  No evidence of old heart attack.  No evidence of ischemia.  The ejection fraction was normal.  Please send a copy to her PCP Dr.Thao Le. ------

## 2014-04-29 ENCOUNTER — Ambulatory Visit (INDEPENDENT_AMBULATORY_CARE_PROVIDER_SITE_OTHER): Payer: BLUE CROSS/BLUE SHIELD | Admitting: Family Medicine

## 2014-04-29 VITALS — BP 132/82 | HR 86 | Temp 97.8°F | Resp 16 | Ht 65.75 in | Wt 227.0 lb

## 2014-04-29 DIAGNOSIS — J45909 Unspecified asthma, uncomplicated: Secondary | ICD-10-CM

## 2014-04-29 DIAGNOSIS — R635 Abnormal weight gain: Secondary | ICD-10-CM

## 2014-04-29 DIAGNOSIS — R519 Headache, unspecified: Secondary | ICD-10-CM

## 2014-04-29 DIAGNOSIS — N951 Menopausal and female climacteric states: Secondary | ICD-10-CM

## 2014-04-29 DIAGNOSIS — L720 Epidermal cyst: Secondary | ICD-10-CM

## 2014-04-29 DIAGNOSIS — R51 Headache: Secondary | ICD-10-CM

## 2014-04-29 LAB — COMPLETE METABOLIC PANEL WITH GFR
ALT: 42 U/L — ABNORMAL HIGH (ref 0–35)
AST: 24 U/L (ref 0–37)
Albumin: 4.4 g/dL (ref 3.5–5.2)
Alkaline Phosphatase: 87 U/L (ref 39–117)
BUN: 14 mg/dL (ref 6–23)
CO2: 27 mEq/L (ref 19–32)
Calcium: 10 mg/dL (ref 8.4–10.5)
Chloride: 104 mEq/L (ref 96–112)
Creat: 0.74 mg/dL (ref 0.50–1.10)
GFR, Est African American: >89 mL/min
GFR, Est Non African American: >89 mL/min
Glucose, Bld: 95 mg/dL (ref 70–99)
Potassium: 4.4 mEq/L (ref 3.5–5.3)
Sodium: 141 mEq/L (ref 135–145)
Total Bilirubin: 0.5 mg/dL (ref 0.2–1.2)
Total Protein: 7.6 g/dL (ref 6.0–8.3)

## 2014-04-29 LAB — TSH: TSH: 4.404 u[IU]/mL (ref 0.350–4.500)

## 2014-04-29 LAB — POCT CBC
Granulocyte percent: 44.7 %G (ref 37–80)
HCT, POC: 41.4 % (ref 37.7–47.9)
Hemoglobin: 13.2 g/dL (ref 12.2–16.2)
Lymph, poc: 2.5 (ref 0.6–3.4)
MCH, POC: 28.2 pg (ref 27–31.2)
MCHC: 31.9 g/dL (ref 31.8–35.4)
MCV: 88.6 fL (ref 80–97)
MID (cbc): 0.3 (ref 0–0.9)
MPV: 7.2 fL (ref 0–99.8)
POC Granulocyte: 2.2 (ref 2–6.9)
POC LYMPH PERCENT: 49.6 %L (ref 10–50)
POC MID %: 5.7 %M (ref 0–12)
Platelet Count, POC: 228 10*3/uL (ref 142–424)
RBC: 4.67 M/uL (ref 4.04–5.48)
RDW, POC: 13.8 %
WBC: 5 10*3/uL (ref 4.6–10.2)

## 2014-04-29 MED ORDER — ALBUTEROL SULFATE 108 (90 BASE) MCG/ACT IN AEPB: 1.0000 | INHALATION_SPRAY | Freq: Four times a day (QID) | RESPIRATORY_TRACT | Status: DC | PRN

## 2014-04-29 MED ORDER — TIOTROPIUM BROMIDE MONOHYDRATE 2.5 MCG/ACT IN AERS: 1.0000 | INHALATION_SPRAY | Freq: Every day | RESPIRATORY_TRACT | Status: DC

## 2014-04-29 NOTE — Progress Notes (Signed)
Chief Complaint:  Chief Complaint  Patient presents with  . Small lump    Back of neck/ Sept 14th, getting bigger  . Medication Refill  . Migraine    Onset last night, now just some residual pain    HPI: Kerri Johnson is a 59 y.o. female who is here for  Multiple complaints:  1. Last night had migraine headache, she has diffuse HA now but much improved after tylenol and advil, she used to have headaches before her hysterectomy, she has had menopause like sxs for 2 years after her surgery She has had  2/10 HA pain  but was a 20/10  last night, she did get a little nauseated , she felt like it was 10 times worse than her prior HA, she felt her eyes got  Tired ut denies any vision changes, ie dark spots/blindness.tunnel vision/leye pain. She is ok now, now she has a faint HA, feels like she has "med head" after she took meds for HA, she denies any slurred speech, she got nauseated around food, she was at an event it was 630 pm.She had to leave because she was nauseated. But nausea is resolved now. She is not sure when her mother ahd menopause, she gets occ night sweats when she sleeps on her left side, she sometimes get sewaty and hot during the day as well.  No CP, new acute SOB, palpitations. No prior hx of HA/migraines. She did have some chinese food when this occurred, not sure if has MSG in it, she had a slight HA before she had the [place] food, when she was at the event the food smells made her nauseated.   2. Last week she had swellin on her left upper abd with pain, and when she eats her abdomen swells up, with  Any type of food. She denies constipation, she ahs GERD  3. She has had a knot on her neck which we have been monitoring forchanges, initially it was thought to be an LAD but it is still present and has gooten bigger even after she was given abx int he past. It hurts when she bends her neck.  She had polyps inside colon during colonoscopy.   4. Hx of asthma, early COPD,  nonsmoker. She needs asthma meds, she saw her [place] doctor but she has not gotten a referral to see a pulmonologist. Dr Awanda MinkJohn Bolt is her prior doctor   BP Readings from Last 3 Encounters:  04/29/14 132/82  12/20/13 123/92  12/12/13 125/78   Wt Readings from Last 3 Encounters:  04/29/14 227 lb (102.967 kg)  12/20/13 218 lb (98.884 kg)  12/12/13 279 lb (126.554 kg)      Past Medical History  Diagnosis Date  . Asthma   . GERD (gastroesophageal reflux disease)   . Colon polyps   . Hypertension   . Thyroid nodule     dx 2009   Past Surgical History  Procedure Laterality Date  . Cesarean section    . Abdominal hysterectomy    . Thyroidectomy, partial     History   Social History  . Marital Status: Married    Spouse Name: N/A  . Number of Children: 3  . Years of Education: N/A   Social History Main Topics  . Smoking status: Never Smoker   . Smokeless tobacco: Never Used  . Alcohol Use: No  . Drug Use: No  . Sexual Activity: Not on file   Other Topics Concern  .  None   Social History Narrative   Family History  Problem Relation Age of Onset  . Diabetes Mother   . Heart disease Mother   . Hypertension Mother   . Diabetes Sister   . Hypertension Sister   . Diabetes Sister    Allergies  Allergen Reactions  . Iodine   . Peanuts [Peanut Oil]   . Shellfish Allergy   . Versed [Midazolam]    Prior to Admission medications   Medication Sig Start Date End Date Taking? Authorizing Provider  acetaminophen (TYLENOL) 325 MG tablet Take 650 mg by mouth every 6 (six) hours as needed.   Yes Historical Provider, MD  albuterol (PROVENTIL HFA;VENTOLIN HFA) 108 (90 BASE) MCG/ACT inhaler Inhale into the lungs every 6 (six) hours as needed for wheezing or shortness of breath.   Yes Historical Provider, MD  omeprazole (PRILOSEC) 20 MG capsule Take 1 capsule (20 mg total) by mouth daily. 11/23/13  Yes Thao P Le, DO  tiotropium (SPIRIVA) 18 MCG inhalation capsule Place 18 mcg into  inhaler and inhale daily.   Yes Historical Provider, MD     ROS: The patient denies fevers, chills, night sweats, unintentional weight loss, chest pain, palpitations, wheezing, dyspnea on exertion, nausea, vomiting, abdominal pain, dysuria, hematuria, melena, numbness, weakness, or tingling.   All other systems have been reviewed and were otherwise negative with the exception of those mentioned in the HPI and as above.    PHYSICAL EXAM: Filed Vitals:   04/29/14 1113  BP: 132/82  Pulse: 86  Temp: 97.8 F (36.6 C)  Resp: 16   Filed Vitals:   04/29/14 1113  Height: 5' 5.75" (1.67 m)  Weight: 227 lb (102.967 kg)   Body mass index is 36.92 kg/(m^2).  General: Alert, no acute distress HEENT:  Normocephalic, atraumatic, oropharynx patent. EOMI, PERRLA, fundo exam nl Cardiovascular:  Regular rate and rhythm, no rubs murmurs or gallops.  No Carotid bruits, radial pulse intact. No pedal edema.  Respiratory: Clear to auscultation bilaterally.  No wheezes, rales, or rhonchi.  No cyanosis, no use of accessory musculature GI: No organomegaly, abdomen is soft and non-tender, positive bowel sounds.  No masses. Skin: + 1.5 by 1 inch posterior neck mass, mobile, appears to more soft tissue/epidermoid cyst like than LAD, no e/o infection  Neurologic: Facial musculature symmetric. Psychiatric: Patient is appropriate throughout our interaction. Lymphatic: No cervical lymphadenopathy Musculoskeletal: Gait intact. CN 2-12 grossly normal   LABS: Results for orders placed or performed in visit on 04/29/14  POCT CBC  Result Value Ref Range   WBC 5.0 4.6 - 10.2 K/uL   Lymph, poc 2.5 0.6 - 3.4   POC LYMPH PERCENT 49.6 10 - 50 %L   MID (cbc) 0.3 0 - 0.9   POC MID % 5.7 0 - 12 %M   POC Granulocyte 2.2 2 - 6.9   Granulocyte percent 44.7 37 - 80 %G   RBC 4.67 4.04 - 5.48 M/uL   Hemoglobin 13.2 12.2 - 16.2 g/dL   HCT, POC 13.0 86.5 - 47.9 %   MCV 88.6 80 - 97 fL   MCH, POC 28.2 27 - 31.2 pg    MCHC 31.9 31.8 - 35.4 g/dL   RDW, POC 78.4 %   Platelet Count, POC 228 142 - 424 K/uL   MPV 7.2 0 - 99.8 fL     EKG/XRAY:   Primary read interpreted by Dr. Conley Rolls at [facility].   ASSESSMENT/PLAN: Encounter Diagnoses  Name Primary?  Marland Kitchen Asthma,  chronic, unspecified asthma severity, uncomplicated Yes  . Weight gain   . Sweats, menopausal   . Acute nonintractable headache, unspecified headache type   . Epidermal cyst of neck    Tylenol prn for HA Monitor for worsening sxs Refer to Pulmonology for hx of asthma/early COPD Refer to General surgery for neck mass in the back of her neck which I think is epidermoid in nature vs possible less likely inflammatory LAD Labs pending  Fu prn   Gross sideeffects, risk and benefits, and alternatives of medications d/w patient. Patient is aware that all medications have potential sideeffects and we are unable to predict every sideeffect or drug-drug interaction that may occur.  Hamilton Capri PHUONG, DO 04/29/2014 12:47 PM

## 2014-04-29 NOTE — Patient Instructions (Signed)

## 2014-04-30 LAB — FSH/LH
FSH: 58.1 m[IU]/mL
LH: 29.4 m[IU]/mL

## 2014-05-02 ENCOUNTER — Telehealth: Payer: Self-pay

## 2014-05-02 ENCOUNTER — Other Ambulatory Visit: Payer: Self-pay | Admitting: Family Medicine

## 2014-05-02 MED ORDER — TIOTROPIUM BROMIDE MONOHYDRATE 1.25 MCG/ACT IN AERS: 1.0000 | INHALATION_SPRAY | Freq: Every day | RESPIRATORY_TRACT | Status: DC

## 2014-05-02 NOTE — Telephone Encounter (Signed)
Sorry it was for the Spiriva. She already picked up the Rx but the pharmacy will replace it for the 1.25 dose if we send in another Rx. She states she just moved here and did not know what dosage her inhaler was. She states the nurse called her from her other doctor's offica and told her the correct dose. Pt has not had her medication in days and she really needs this changed because she has COPD. Can someone look at this for me and see if we can send this in for pt?  Tiotropium Bromide Monohydrate (SPIRIVA RESPIMAT) 2.5 MCG/ACT AERS [[id number]][[id number]]      Order Details    Dose: 1 puff Route: Inhalation Frequency: Daily   Dispense Quantity:  1 Inhaler Refills:  12 Fills Remaining:  12          Sig: Inhale 1 puff into the lungs daily.         Written Date:  04/29/14 Expiration Date:  04/29/15     Start Date:  04/29/14 End Date:  --     Ordering Provider:  -- Authorizing Provider:  Lenell Antuhao P Le, DO Ordering User:  Thao P Le, DO          Diagnosis Association: Asthma, chronic, unspecified asthma severity, uncomplicated (493.90)             Pharmacy:  [id number] - [place] ([place]), [place] - [address]

## 2014-05-02 NOTE — Telephone Encounter (Signed)
Already done! Thanks

## 2014-05-02 NOTE — Telephone Encounter (Signed)
Albuterol Sulfate (PROAIR RESPICLICK) 108 (90 BASE) MCG/ACT AEPB [[id number]][[id number]]     Order Details    Dose: 1 puff Route: Inhalation Frequency: Every 6 hours PRN   Dispense Quantity:  1 each Refills:  12 Fills Remaining:  12          Sig: Inhale 1 puff into the lungs every 6 (six) hours as needed.         Written Date:  04/29/14 Expiration Date:  04/29/15     Start Date:  04/29/14 End Date:  --     Ordering Provider:  -- Authorizing Provider:  Lenell Antuhao P Le, DO Ordering User:  Thao P Le, DO         Can I change the Rx?

## 2014-05-02 NOTE — Telephone Encounter (Signed)
Patient gave provider wrong dosage of medication on her asthma prescription  It is 125 mg   [id number]

## 2014-05-02 NOTE — Telephone Encounter (Signed)
Ok to send in the correct dose.

## 2014-05-10 ENCOUNTER — Ambulatory Visit (INDEPENDENT_AMBULATORY_CARE_PROVIDER_SITE_OTHER)
Admission: RE | Admit: 2014-05-10 | Discharge: 2014-05-10 | Disposition: A | Discharge status: Home / Self Care | Payer: BLUE CROSS/BLUE SHIELD | Source: Ambulatory Visit | Attending: Internal Medicine | Admitting: Internal Medicine

## 2014-05-10 ENCOUNTER — Ambulatory Visit (INDEPENDENT_AMBULATORY_CARE_PROVIDER_SITE_OTHER): Payer: BLUE CROSS/BLUE SHIELD | Admitting: Internal Medicine

## 2014-05-10 ENCOUNTER — Encounter: Payer: Self-pay | Admitting: Internal Medicine

## 2014-05-10 VITALS — BP 140/96 | HR 84 | Temp 98.6°F | Ht 66.0 in | Wt 232.0 lb

## 2014-05-10 DIAGNOSIS — J4540 Moderate persistent asthma, uncomplicated: Secondary | ICD-10-CM | POA: Insufficient documentation

## 2014-05-10 DIAGNOSIS — J4530 Mild persistent asthma, uncomplicated: Secondary | ICD-10-CM

## 2014-05-10 NOTE — Patient Instructions (Signed)
If not doing well with your reflux symptoms or breathing change prilosec to Take 30- 60 min before your first and last meals of the day   GERD (REFLUX)  is an extremely common cause of respiratory symptoms just like yours , many times with no obvious heartburn at all.    It can be treated with medication, but also with lifestyle changes including avoidance of late meals, excessive alcohol, smoking cessation, and avoid fatty foods, chocolate, peppermint, colas, red wine, and acidic juices such as orange juice.  NO MINT OR MENTHOL PRODUCTS SO NO COUGH DROPS  USE SUGARLESS CANDY INSTEAD (Jolley ranchers or Stover's or Life Savers) or even ice chips will also do - the key is to swallow to prevent all throat clearing. NO OIL BASED VITAMINS - use powdered substitutes.  Please schedule a follow up visit in 3 months but call sooner if needed.

## 2014-05-10 NOTE — Progress Notes (Signed)
Subjective:     Patient ID: Kerri Johnson, female   DOB: [birth year],   MRN: [id number]  HPI   [age] [place]ob[place] with problems with asthma as long back as she can remember initially just prn rx for  intermittent problems then changed over maint rx since 1990s most recently under care of [facility] doctor = [place] on spiriva/ respimat daily (apparently failed advair) and [place] [facility] [place] referred to pulmonary clinic 05/10/14     05/10/2014 1st [facility] office visit/ Kerri Johnson   Chief Complaint  Patient presents with  . Advice Only    ref by Dr. Hamilton Johnson Le; asthma/COPD; needs to est. care since moving here. no SOB/cough  bleach and cold weather cause flares, she's  not typically limited by breathing with 2 flares of asthma in last 6 months requiring proaire up to 3 x daily but not typically not needing any prednisone and no recent flares, happy with spiriva and no recent need for proair   No obvious day to day or daytime variabilty or assoc chronic cough or cp or chest tightness, subjective wheeze overt sinus or hb symptoms. No unusual exp hx or h/o childhood pna/ asthma or knowledge of premature birth.  Sleeping ok without nocturnal  or early am exacerbation  of respiratory  c/o's or need for noct saba. Also denies any obvious fluctuation of symptoms with weather or environmental changes or other aggravating or alleviating factors except as outlined above   Current Medications, Allergies, Complete Past Medical History, Past Surgical History, Family History, and Social History were reviewed in [facility] electronic medical record.  ROS  The following are not active complaints unless bolded sore throat, dysphagia, dental problems, itching, sneezing,  nasal congestion or excess/ purulent secretions, ear ache,   fever, chills, sweats, unintended wt loss, pleuritic or exertional cp, hemoptysis,  orthopnea pnd or leg  swelling, presyncope, palpitations, heartburn, abdominal pain, anorexia, nausea, vomiting, diarrhea  or change in bowel or urinary habits, change in stools or urine, dysuria,hematuria,  rash, arthralgias, visual complaints, headache, numbness weakness or ataxia or problems with walking or coordination,  change in mood/affect or memory.           Review of Systems     Objective:   Physical Exam amb bf nad  Wt Readings from Last 3 Encounters:  05/10/14 232 lb (105.235 kg)  04/29/14 227 lb (102.967 kg)  12/20/13 218 lb (98.884 kg)    Vital signs reviewed   HEENT: nl dentition, turbinates, and orophanx. Nl external ear canals without cough reflex   NECK :  without JVD/Nodes/TM/ nl carotid upstrokes bilaterally   LUNGS: no acc muscle use, clear to A and P bilaterally without cough on insp or exp maneuvers   CV:  RRR  no s3 or murmur or increase in P2, no edema   ABD:  soft and nontender with nl excursion in the supine position. No bruits or organomegaly, bowel sounds nl  MS:  warm without deformities, calf tenderness, cyanosis or clubbing  SKIN: warm and dry without lesions    NEURO:  alert, approp, no deficits   CXR PA and Lateral:   05/10/2014 :     I personally reviewed images and agree with radiology impression as follows:      Hyperinflation consistent with known reactive airway disease. There is no pneumonia nor other acute cardiopulmonary abnormality.      Assessment:

## 2014-05-12 ENCOUNTER — Encounter: Payer: Self-pay | Admitting: Internal Medicine

## 2014-05-12 NOTE — Assessment & Plan Note (Signed)
She has such longstanding asthma that she probably has some remodeling that is not fully reversible but I prefer to call that chronic asthma, not copd  She's being treated however for copd and not chronic asthma at this point so needs to return for pfts and follow the rule of two's for saba use as there is a risk of tachyphylaxis since she is not on ICS, which she apparently could not tolerate     Each maintenance medication was reviewed in detail including most importantly the difference between maintenance and as needed and under what circumstances the prns are to be used.  Please see instructions for details which were reviewed in writing and the patient given a copy.

## 2014-05-13 NOTE — Progress Notes (Signed)
Quick Note:  Spoke with pt and notified of results per Dr. Wert. Pt verbalized understanding and denied any questions.  ______ 

## 2014-05-18 ENCOUNTER — Encounter: Payer: Self-pay | Admitting: Family Medicine

## 2014-06-26 ENCOUNTER — Ambulatory Visit (INDEPENDENT_AMBULATORY_CARE_PROVIDER_SITE_OTHER): Payer: 59 | Admitting: Internal Medicine

## 2014-06-26 ENCOUNTER — Encounter: Payer: Self-pay | Admitting: Internal Medicine

## 2014-06-26 VITALS — BP 134/82 | HR 94 | Ht 67.0 in | Wt 222.0 lb

## 2014-06-26 DIAGNOSIS — J4540 Moderate persistent asthma, uncomplicated: Secondary | ICD-10-CM

## 2014-06-26 DIAGNOSIS — J4530 Mild persistent asthma, uncomplicated: Secondary | ICD-10-CM | POA: Diagnosis not present

## 2014-06-26 MED ORDER — PREDNISONE 10 MG PO TABS: ORAL_TABLET | ORAL | Status: DC

## 2014-06-26 MED ORDER — BUDESONIDE-FORMOTEROL FUMARATE 80-4.5 MCG/ACT IN AERO: INHALATION_SPRAY | RESPIRATORY_TRACT | Status: DC

## 2014-06-26 MED ORDER — PANTOPRAZOLE SODIUM 40 MG PO TBEC: 40.0000 mg | DELAYED_RELEASE_TABLET | Freq: Every day | ORAL | Status: DC

## 2014-06-26 MED ORDER — ALBUTEROL SULFATE HFA 108 (90 BASE) MCG/ACT IN AERS: INHALATION_SPRAY | RESPIRATORY_TRACT | Status: DC

## 2014-06-26 NOTE — Progress Notes (Signed)
Subjective:     Patient ID: Kerri Johnson, female   DOB: September 27, 1955,   MRN: [id number]  HPI   57 yobf [place] with problems with asthma as long back as she can remember initially just prn rx for  intermittent problems then changed over maint rx since 1990s most recently under care of [facility] doctor = Awanda Mink [place] spiriva/ respimat daily (apparently failed advair) and moved to [facility] summer 2015 referred to pulmonary clinic 05/10/14     05/10/2014 1st [facility] office visit/ Wert   Chief Complaint  Patient presents with  . Advice Only    ref by Dr. Hamilton Capri; asthma/COPD; needs to est. care since moving here. no SOB/cough  bleach and cold weather cause flares, she's  not typically limited by breathing with 2 flares of asthma in last 6 months requiring proaire up to 3 x daily but not typically not needing any prednisone and no recent flares, happy with spiriva and no recent need for proair rec If not doing well with your reflux symptoms or breathing change prilosec to Take 30- 60 min before your first and last meals of the day  GERD  Diet  Please schedule a follow up visit in 3 months but call sooner if needed.     06/26/2014 f/u ov/Wert re: copd vs chronic  asthma/ maint rx with spiriva x 3 years per Dr Donnalee Curry (pulmonary)  Chief Complaint  Patient presents with  . Acute Visit    Pt c/o increased SOB for the past 5 days- started after they installed a new bathroom in her home. She is also wheezing and having chest tightness. She is using rescue inhaler 4-6 x per day.    using proaire respiclick/ severe hoarseness / did not activate "action plan"    No obvious day to day or daytime variabilty or assoc excess/ purulent mucus or cp or   overt sinus or hb symptoms. No other  unusual exp hx or h/o childhood pna/ asthma or knowledge of premature birth.  Sleeping ok without nocturnal  or early am exacerbation  of  respiratory  c/o's or need for noct saba. Also denies any obvious fluctuation of symptoms with weather or environmental changes or other aggravating or alleviating factors except as outlined above   Current Medications, Allergies, Complete Past Medical History, Past Surgical History, Family History, and Social History were reviewed in [facility] record.  ROS  The following are not active complaints unless bolded sore throat, dysphagia, dental problems, itching, sneezing,  nasal congestion or excess/ purulent secretions, ear ache,   fever, chills, sweats, unintended wt loss, pleuritic or exertional cp, hemoptysis,  orthopnea pnd or leg swelling, presyncope, palpitations, heartburn, abdominal pain, anorexia, nausea, vomiting, diarrhea  or change in bowel or urinary habits, change in stools or urine, dysuria,hematuria,  rash, arthralgias, visual complaints, headache, numbness weakness or ataxia or problems with walking or coordination,  change in mood/affect or memory.               Objective:   Physical Exam  amb bf nad  06/26/2014        222  Wt Readings from Last 3 Encounters:  05/10/14 232 lb (105.235 kg)  04/29/14 227 lb (102.967 kg)  12/20/13 218 lb (98.884 kg)    Vital signs reviewed   HEENT: nl dentition, turbinates, and orophanx. Nl external ear canals without cough reflex   NECK :  without JVD/Nodes/TM/ nl carotid upstrokes bilaterally   LUNGS: no acc muscle use, clear to A and P bilaterally without cough on insp or exp maneuvers   CV:  RRR  no s3 or murmur or increase in P2, no edema   ABD:  soft and nontender with nl excursion in the supine position. No bruits or organomegaly, bowel sounds nl  MS:  warm without deformities, calf tenderness, cyanosis or clubbing  SKIN: warm and dry without lesions    NEURO:  alert, approp, no deficits   CXR PA and Lateral:   05/10/2014 :     I personally reviewed images and agree with radiology impression as  follows:      Hyperinflation consistent with known reactive airway disease. There is no pneumonia nor other acute cardiopulmonary abnormality.      Assessment:

## 2014-06-26 NOTE — Patient Instructions (Signed)
Plan A = Automatic  = symbicort 80 Take 2 puffs first thing in am and then another 2 puffs about 12 hours later and continue spiriva for now as well               Pantoprazole (protonix) 40 mg   Take 30-60 min before first meal of the day and prilosec 20 mg 30 min before supper  Plan B  Only use your albutero(proair)  as a rescue medication to be used if you can't catch your breath by resting or doing a relaxed purse lip breathing pattern.  - The less you use it, the better it will work when you need it. - Ok to use up to 2 puffs  every 4 hours if you must but call for immediate appointment if use goes up over your usual need - Don't leave home without it !!  (think of it like the spare tire for your car)   Plan C = contingency> Prednisone 10 mg take  4 each am x 2 days,   2 each am x 2 days,  1 each am x 2 days and stop    GERD (REFLUX)  is an extremely common cause of respiratory symptoms just like yours , many times with no obvious heartburn at all.    It can be treated with medication, but also with lifestyle changes including avoidance of late meals, excessive alcohol, smoking cessation, and avoid fatty foods, chocolate, peppermint, colas, red wine, and acidic juices such as orange juice.  NO MINT OR MENTHOL PRODUCTS SO NO COUGH DROPS  USE SUGARLESS CANDY INSTEAD (Jolley ranchers or Stover's or Life Savers) or even ice chips will also do - the key is to swallow to prevent all throat clearing. NO OIL BASED VITAMINS - use powdered substitutes    Please schedule a follow up office visit in 4 weeks, sooner if needed with pfts

## 2014-06-28 ENCOUNTER — Encounter (INDEPENDENT_AMBULATORY_CARE_PROVIDER_SITE_OTHER): Payer: Self-pay

## 2014-06-28 ENCOUNTER — Ambulatory Visit (HOSPITAL_COMMUNITY)
Admission: RE | Admit: 2014-06-28 | Discharge: 2014-06-28 | Disposition: A | Discharge status: Home / Self Care | Payer: 59 | Source: Ambulatory Visit | Attending: Internal Medicine | Admitting: Internal Medicine

## 2014-06-28 DIAGNOSIS — R0609 Other forms of dyspnea: Secondary | ICD-10-CM | POA: Insufficient documentation

## 2014-06-28 DIAGNOSIS — R05 Cough: Secondary | ICD-10-CM | POA: Insufficient documentation

## 2014-06-28 DIAGNOSIS — J4530 Mild persistent asthma, uncomplicated: Secondary | ICD-10-CM | POA: Insufficient documentation

## 2014-06-28 LAB — PULMONARY FUNCTION TEST
DL/VA % pred: 88 %
DL/VA: 4.58 ml/min/mmHg/L
DLCO unc % pred: 74 %
DLCO unc: 20.96 ml/min/mmHg
FEF 25-75 Post: 1.39 L/sec
FEF 25-75 Pre: 0.57 L/sec
FEF2575-%Change-Post: 143 %
FEF2575-%Pred-Post: 57 %
FEF2575-%Pred-Pre: 23 %
FEV1-%Change-Post: 38 %
FEV1-%Pred-Post: 73 %
FEV1-%Pred-Pre: 53 %
FEV1-Post: 1.8 L
FEV1-Pre: 1.3 L
FEV1FVC-%Change-Post: -1 %
FEV1FVC-%Pred-Pre: 68 %
FEV6-%Change-Post: 34 %
FEV6-%Pred-Post: 100 %
FEV6-%Pred-Pre: 74 %
FEV6-Post: 3.01 L
FEV6-Pre: 2.24 L
FEV6FVC-%Change-Post: -3 %
FEV6FVC-%Pred-Post: 94 %
FEV6FVC-%Pred-Pre: 97 %
FVC-%Change-Post: 40 %
FVC-%Pred-Post: 107 %
FVC-%Pred-Pre: 76 %
FVC-Post: 3.32 L
FVC-Pre: 2.36 L
Post FEV1/FVC ratio: 54 %
Post FEV6/FVC ratio: 91 %
Pre FEV1/FVC ratio: 55 %
Pre FEV6/FVC Ratio: 95 %
RV % pred: 142 %
RV: 2.94 L
TLC % pred: 100 %
TLC: 5.54 L

## 2014-06-28 MED ORDER — ALBUTEROL SULFATE (2.5 MG/3ML) 0.083% IN NEBU
2.5000 mg | INHALATION_SOLUTION | Freq: Once | RESPIRATORY_TRACT | Status: AC
  Administered 2014-06-28: 2.5 mg via RESPIRATORY_TRACT

## 2014-06-30 ENCOUNTER — Encounter: Payer: Self-pay | Admitting: Internal Medicine

## 2014-06-30 NOTE — Assessment & Plan Note (Addendum)
The proper method of use, as well as anticipated side effects, of a metered-dose inhaler are discussed and demonstrated to the patient. Improved effectiveness after extensive coaching during this visit to a level of approximately  75%    I had an extended discussion with the patient reviewing all relevant studies completed to date and  lasting 15 to 20 minutes of a 25 minute visit on the following ongoing concerns:   1) this is asthma/ not copd in a never smoker  2) may have component of UACS so needs  Max rx for gerd and gentle introduction to LABA/ ICS  3 )Formulary restrictions may  be an ongoing challenge for the forseable future and I would be happy to pick an alternative if the pt will provide me a list of them but pt  will need to return here for training for any new device that is required eg dpi vs hfa vs respimat.  In meantime we can always provide samples to assure  patient does not ever run out of the medication and end up in the ER (which will cost the insurance company much more than a visit here and risk patient's health as well).  4) Each maintenance medication was reviewed in detail including most importantly the difference between maintenance and as needed and under what circumstances the prns are to be used.  Please see instructions for details which were reviewed in writing and the patient given a copy.

## 2014-07-01 ENCOUNTER — Telehealth: Payer: Self-pay | Admitting: *Deleted

## 2014-07-01 NOTE — Telephone Encounter (Signed)
Symbicort 80-4.5 10.200/30 day supply [facility] [id number] Approved [id number] Pharmacy/PT notified.

## 2014-07-24 ENCOUNTER — Ambulatory Visit: Payer: 59 | Admitting: Internal Medicine

## 2014-08-16 ENCOUNTER — Ambulatory Visit (INDEPENDENT_AMBULATORY_CARE_PROVIDER_SITE_OTHER): Payer: 59 | Admitting: Internal Medicine

## 2014-08-16 ENCOUNTER — Encounter: Payer: Self-pay | Admitting: Internal Medicine

## 2014-08-16 VITALS — BP 142/96 | HR 95 | Ht 67.0 in | Wt 225.0 lb

## 2014-08-16 DIAGNOSIS — J4540 Moderate persistent asthma, uncomplicated: Secondary | ICD-10-CM

## 2014-08-16 MED ORDER — BECLOMETHASONE DIPROPIONATE 80 MCG/ACT IN AERS: INHALATION_SPRAY | RESPIRATORY_TRACT | Status: DC

## 2014-08-16 NOTE — Patient Instructions (Addendum)
Plan A = Automatic  = Qvar  80 Take 2 puffs first thing in am and then another 2 puffs about 12 hours later and continue spiriva for now as well               Pantoprazole (protonix) 40 mg   Take 30-60 min before first meal of the day and prilosec 20 mg 30 min before supper for now  Plan B  Only use your albutero(proair)  as a rescue medication to be used if you can't catch your breath by resting or doing a relaxed purse lip breathing pattern.  - The less you use it, the better it will work when you need it. - Ok to use up to 2 puffs  every 4 hours if you must but call for immediate appointment if use goes up over your usual need - Don't leave home without it !!  (think of it like the spare tire for your car)      Please schedule a follow up office visit in 4 weeks, sooner if needed with spirometry

## 2014-08-16 NOTE — Progress Notes (Signed)
Subjective:     Patient ID: Kerri Johnson, female   DOB: 1955-11-20,   MRN: [id number]     Brief patient profile:  53 yobf never smoker grew up [place]  [place] with problems with asthma as long back as she can remember initially just prn rx for  intermittent problems then changed over maint rx since 1990s most recently under care of [facility] doctor = Awanda Mink in [place] on spiriva/ respimat daily (apparently failed advair) and moved to [facility] summer 2015 referred to pulmonary clinic 05/10/14     05/10/2014 1st [facility] office visit/ Rawad Bochicchio   Chief Complaint  Patient presents with  . Advice Only    ref by Dr. Hamilton Capri; asthma/COPD; needs to est. care since moving here. no SOB/cough  bleach and cold weather cause flares, she's  not typically limited by breathing with 2 flares of asthma in last 6 months requiring proaire up to 3 x daily but not typically not needing any prednisone and no recent flares, happy with spiriva and no recent need for proair rec If not doing well with your reflux symptoms or breathing change prilosec to Take 30- 60 min before your first and last meals of the day  GERD  Diet  Please schedule a follow up visit in 3 months but call sooner if needed.     06/26/2014 f/u ov/Leonid Manus re: copd vs chronic  asthma/ maint rx with spiriva x 3 years per Dr Donnalee Curry (pulmonary)  Chief Complaint  Patient presents with  . Acute Visit    Pt c/o increased SOB for the past 5 days- started after they installed a new bathroom in her home. She is also wheezing and having chest tightness. She is using rescue inhaler 4-6 x per day.   using proaire respiclick/ severe hoarseness / did not activate "action plan"  rec Plan A = Automatic  = symbicort 80 Take 2 puffs first thing in am and then another 2 puffs about 12 hours later and continue spiriva for now as well               Pantoprazole (protonix) 40 mg   Take 30-60 min before first meal of the day  and prilosec 20 mg 30 min before supper Plan B  Only use your albutero(proair)   )  Plan C = contingency> Prednisone 10 mg take  4 each am x 2 days,   2 each am x 2 days,  1 each am x 2 days and stop  GERD diet Please schedule a follow up office visit in 4 weeks, sooner if needed with pfts     08/16/2014 f/u ov/Adolphus Hanf re: chronic asthma / undertreated inflammatory component on spiriva 1.25 mg only  Chief Complaint  Patient presents with  . Follow-up    Pt c/o wheezing, sore throat, dry cough. Slight chest congestion and PND. Pt states she no longer takes symbicort due to chest pain and rapid heart rate.   could not tolerate symbicort more than a week  Thinks staying out of bad part of house is what really helps her breathing / rarely feeling need for saba/ worse x 3 days with sore throat but no fever, denies missing doses of ppi/h2hs    No obvious day to day or daytime variabilty or assoc excess/ purulent mucus or cp or   overt sinus or hb symptoms. No other  unusual exp hx or h/o childhood pna/ asthma or knowledge of premature birth.  Sleeping ok without nocturnal  or early am exacerbation  of respiratory  c/o's or need for noct saba. Also denies any obvious fluctuation of symptoms with weather or environmental changes or other aggravating or alleviating factors except as outlined above   Current Medications, Allergies, Complete Past Medical History, Past Surgical History, Family History, and Social History were reviewed in [employer] record.  ROS  The following are not active complaints unless bolded sore throat, dysphagia, dental problems, itching, sneezing,  nasal congestion or excess/ purulent secretions, ear ache,   fever, chills, sweats, unintended wt loss, pleuritic or exertional cp, hemoptysis,  orthopnea pnd or leg swelling, presyncope, palpitations, heartburn, abdominal pain, anorexia, nausea, vomiting, diarrhea  or change in bowel or urinary habits, change in  stools or urine, dysuria,hematuria,  rash, arthralgias, visual complaints, headache, numbness weakness or ataxia or problems with walking or coordination,  change in mood/affect or memory.               Objective:   Physical Exam  amb obese  bf nad  06/26/2014        222 >  08/16/2014  Wt Readings from Last 3 Encounters:  05/10/14 232 lb (105.235 kg)  04/29/14 227 lb (102.967 kg)  12/20/13 218 lb (98.884 kg)    Vital signs reviewed   HEENT: nl dentition, turbinates, and orophanx. Nl external ear canals without cough reflex   NECK :  without JVD/Nodes/TM/ nl carotid upstrokes bilaterally   LUNGS: no acc muscle use, clear to A and P bilaterally without cough on insp or exp maneuvers   CV:  RRR  no s3 or murmur or increase in P2, no edema   ABD:  soft and nontender with nl excursion in the supine position. No bruits or organomegaly, bowel sounds nl  MS:  warm without deformities, calf tenderness, cyanosis or clubbing  SKIN: warm and dry without lesions    NEURO:  alert, approp, no deficits   CXR PA and Lateral:   05/10/2014 :     I personally reviewed images and agree with radiology impression as follows:      Hyperinflation consistent with known reactive airway disease. There is no pneumonia nor other acute cardiopulmonary abnormality.      Assessment:

## 2014-08-17 ENCOUNTER — Encounter: Payer: Self-pay | Admitting: Internal Medicine

## 2014-08-17 NOTE — Assessment & Plan Note (Signed)
-   started on symbicort 80 2bid 06/26/14 > stopped  07/07/14   - PFTs  06/28/14 done during acute flare  FEV1  1.80 (73%) ratio 54 p 38% from bronchodilators and  Near nl dlco - 08/16/2014 p extensive coaching HFA effectiveness =    75% > try qvar 80 2bid   I had an extended discussion with the patient and husband Kerri Johnson reviewing all relevant studies completed to date and  lasting 15 to 20 minutes of a 25 minute visit on the following ongoing concerns:   1)     SABA/LAMA  Treats the symptoms but don't get to the underlying problem (inflammation).  I used  the analogy of putting steroid cream on a rash to help explain the meaning of topical therapy and the need to get the drug to the target tissue.    2) can't tol symb due to sympathomimetic effects   3) best choice then is ICS / LAMA for now > Discussed in detail all the  indications, usual  Risks(comparing repeated doses of prednisone to very small daily dose of steroids with ICS)  and alternatives  relative to the benefits with patient who agrees to proceed with trial of qvar 80 2bid x 4 week sample then regroup.

## 2014-09-02 ENCOUNTER — Telehealth: Payer: Self-pay

## 2014-09-02 ENCOUNTER — Ambulatory Visit (INDEPENDENT_AMBULATORY_CARE_PROVIDER_SITE_OTHER): Payer: 59 | Admitting: Family Medicine

## 2014-09-02 ENCOUNTER — Ambulatory Visit (INDEPENDENT_AMBULATORY_CARE_PROVIDER_SITE_OTHER): Payer: 59

## 2014-09-02 VITALS — BP 124/80 | HR 75 | Temp 98.9°F | Resp 18 | Ht 66.0 in | Wt 222.0 lb

## 2014-09-02 DIAGNOSIS — K5900 Constipation, unspecified: Secondary | ICD-10-CM

## 2014-09-02 DIAGNOSIS — R319 Hematuria, unspecified: Secondary | ICD-10-CM | POA: Diagnosis not present

## 2014-09-02 DIAGNOSIS — R1084 Generalized abdominal pain: Secondary | ICD-10-CM | POA: Diagnosis not present

## 2014-09-02 LAB — COMPLETE METABOLIC PANEL WITH GFR
ALT: 45 U/L — ABNORMAL HIGH (ref 0–35)
AST: 28 U/L (ref 0–37)
Albumin: 4.4 g/dL (ref 3.5–5.2)
Alkaline Phosphatase: 84 U/L (ref 39–117)
BUN: 13 mg/dL (ref 6–23)
CO2: 28 mEq/L (ref 19–32)
Calcium: 10.1 mg/dL (ref 8.4–10.5)
Chloride: 103 mEq/L (ref 96–112)
Creat: 0.78 mg/dL (ref 0.50–1.10)
GFR, Est African American: >89 mL/min
GFR, Est Non African American: 85 mL/min
Glucose, Bld: 77 mg/dL (ref 70–99)
Potassium: 4.4 mEq/L (ref 3.5–5.3)
Sodium: 139 mEq/L (ref 135–145)
Total Bilirubin: 0.7 mg/dL (ref 0.2–1.2)
Total Protein: 7.7 g/dL (ref 6.0–8.3)

## 2014-09-02 LAB — POCT CBC
Granulocyte percent: 37.6 %G (ref 37–80)
HCT, POC: 41.5 % (ref 37.7–47.9)
Hemoglobin: 12.7 g/dL (ref 12.2–16.2)
Lymph, poc: 2.9 (ref 0.6–3.4)
MCH, POC: 26.9 pg — AB (ref 27–31.2)
MCHC: 30.6 g/dL — AB (ref 31.8–35.4)
MCV: 87.8 fL (ref 80–97)
MID (cbc): 0.4 (ref 0–0.9)
MPV: 7.2 fL (ref 0–99.8)
POC Granulocyte: 2 (ref 2–6.9)
POC LYMPH PERCENT: 55.4 %L — AB (ref 10–50)
POC MID %: 7 %M (ref 0–12)
Platelet Count, POC: 241 10*3/uL (ref 142–424)
RBC: 4.73 M/uL (ref 4.04–5.48)
RDW, POC: 15 %
WBC: 5.2 10*3/uL (ref 4.6–10.2)

## 2014-09-02 LAB — POCT URINALYSIS DIPSTICK
Bilirubin, UA: NEGATIVE
Glucose, UA: NEGATIVE
Ketones, UA: NEGATIVE
Leukocytes, UA: NEGATIVE
Nitrite, UA: NEGATIVE
Protein, UA: NEGATIVE
Spec Grav, UA: 1.025
Urobilinogen, UA: 0.2
pH, UA: 5.5

## 2014-09-02 LAB — POCT UA - MICROSCOPIC ONLY
Casts, Ur, LPF, POC: NEGATIVE
Crystals, Ur, HPF, POC: NEGATIVE
Mucus, UA: NEGATIVE
Yeast, UA: NEGATIVE

## 2014-09-02 LAB — LIPASE: Lipase: 32 U/L (ref 0–75)

## 2014-09-02 NOTE — Telephone Encounter (Signed)
Spoke with pt, advised her to come in to be seen. Pt agreed.

## 2014-09-02 NOTE — Telephone Encounter (Signed)
This message is from answering service  Patient is having abdominal pain and swelling when she eats and needs a call immediately. Phone: [phone]

## 2014-09-02 NOTE — Progress Notes (Signed)
Chief Complaint:  Chief Complaint  Patient presents with  . Abdominal Pain    hernia issue     HPI: Kerri Johnson is a 59 y.o. female who is here for  3 week history of worsening abdominal pain, and the last 3 a she's got worsening diffuse  abd pain. She does have a history of reflux but has been taking Prilosec 20 mg nightly and also Protonix 40 mg in the daytime. She has pain that is diffuse in the right and left upper quadrant that is associated with food. She has bloating. She does strain. She has had some blood in her stools. It has been hard. She has had a colonoscopy in 2015 which was normal except for internal hemorrhoids. She denies any fevers. She has had some nausea and bloating. No vomiting. She did eat out on Friday but this is happening prior to all of this. No one in her family has the symptoms. NO fevers chill cp sob palpitations  She has had a gallbladder ultrasound in 2007 which was unremarkable. She is worried she may have a hernia.   Past Medical History  Diagnosis Date  . Asthma   . GERD (gastroesophageal reflux disease)   . Colon polyps   . Hypertension   . Thyroid nodule     dx 2009   Past Surgical History  Procedure Laterality Date  . Cesarean section    . Abdominal hysterectomy    . Thyroidectomy, partial     History   Social History  . Marital Status: Married    Spouse Name: N/A  . Number of Children: 3  . Years of Education: N/A   Social History Main Topics  . Smoking status: Never Smoker   . Smokeless tobacco: Never Used  . Alcohol Use: No  . Drug Use: No  . Sexual Activity: Not on file   Other Topics Concern  . None   Social History Narrative   Family History  Problem Relation Age of Onset  . Diabetes Mother   . Heart disease Mother   . Hypertension Mother   . Diabetes Sister   . Hypertension Sister   . Diabetes Sister    Allergies  Allergen Reactions  . Iodine   . Peanuts [Peanut Oil]   . Shellfish Allergy   . Versed  [Midazolam]    Prior to Admission medications   Medication Sig Start Date End Date Taking? Authorizing Provider  albuterol (PROAIR HFA) 108 (90 BASE) MCG/ACT inhaler 2 puffs up to every 4 hours if can't catch your breath 06/26/14  Yes Nyoka Cowden, MD  beclomethasone (QVAR) 80 MCG/ACT inhaler Take 2 puffs first thing in am and then another 2 puffs about 12 hours later. 08/16/14  Yes Nyoka Cowden, MD  omeprazole (PRILOSEC) 20 MG capsule Take 1 capsule (20 mg total) by mouth daily. 11/23/13  Yes Thao P Le, DO  pantoprazole (PROTONIX) 40 MG tablet Take 1 tablet (40 mg total) by mouth daily. Take 30-60 min before first meal of the day 06/26/14  Yes Nyoka Cowden, MD  acetaminophen (TYLENOL) 325 MG tablet Take 650 mg by mouth every 6 (six) hours as needed.    Historical Provider, MD  Tiotropium Bromide Monohydrate (SPIRIVA RESPIMAT) 1.25 MCG/ACT AERS Inhale 1 puff into the lungs daily. Patient not taking: Reported on 09/02/2014 05/02/14   Thao P Le, DO     ROS: The patient denies fevers, chills, night sweats, unintentional weight loss, chest  pain, palpitations, wheezing, dyspnea on exertion, dysuria, hematuria, melena, numbness, weakness, or tingling.   All other systems have been reviewed and were otherwise negative with the exception of those mentioned in the HPI and as above.    PHYSICAL EXAM: Filed Vitals:   09/02/14 1246  BP: 124/80  Pulse: 75  Temp: 98.9 F (37.2 C)  Resp: 18   Filed Vitals:   09/02/14 1246  Height: 5\' 6"  (1.676 m)  Weight: 222 lb (100.699 kg)   Body mass index is 35.85 kg/(m^2).   General: Alert, no acute distress HEENT:  Normocephalic, atraumatic, oropharynx patent. EOMI, PERRLA Cardiovascular:  Regular rate and rhythm, no rubs murmurs or gallops.  No Carotid bruits, radial pulse intact. No pedal edema.  Respiratory: Clear to auscultation bilaterally.  No wheezes, rales, or rhonchi.  No cyanosis, no use of accessory musculature GI: No organomegaly, abdomen is  soft and non-tender, positive bowel sounds.  No masses. No appreciable ventral hernia.  Skin: No rashes. Neurologic: Facial musculature symmetric. Psychiatric: Patient is appropriate throughout our interaction. Lymphatic: No cervical lymphadenopathy Musculoskeletal: Gait intact.   LABS: Results for orders placed or performed in visit on 09/02/14  POCT CBC  Result Value Ref Range   WBC 5.2 4.6 - 10.2 K/uL   Lymph, poc 2.9 0.6 - 3.4   POC LYMPH PERCENT 55.4 (A) 10 - 50 %L   MID (cbc) 0.4 0 - 0.9   POC MID % 7.0 0 - 12 %M   POC Granulocyte 2.0 2 - 6.9   Granulocyte percent 37.6 37 - 80 %G   RBC 4.73 4.04 - 5.48 M/uL   Hemoglobin 12.7 12.2 - 16.2 g/dL   HCT, POC 16.141.5 09.637.7 - 47.9 %   MCV 87.8 80 - 97 fL   MCH, POC 26.9 (A) 27 - 31.2 pg   MCHC 30.6 (A) 31.8 - 35.4 g/dL   RDW, POC 04.515.0 %   Platelet Count, POC 241 142 - 424 K/uL   MPV 7.2 0 - 99.8 fL  POCT UA - Microscopic Only  Result Value Ref Range   WBC, Ur, HPF, POC 0-1    RBC, urine, microscopic 1-5    Bacteria, U Microscopic few    Mucus, UA neg    Epithelial cells, urine per micros 1-3    Crystals, Ur, HPF, POC neg    Casts, Ur, LPF, POC neg    Yeast, UA neg   POCT urinalysis dipstick  Result Value Ref Range   Color, UA yellow    Clarity, UA clear    Glucose, UA neg    Bilirubin, UA neg    Ketones, UA neg    Spec Grav, UA 1.025    Blood, UA trace-lysed    pH, UA 5.5    Protein, UA neg    Urobilinogen, UA 0.2    Nitrite, UA neg    Leukocytes, UA Negative Negative     EKG/XRAY:   [facility]. Mod stool burden , no free air No pneumo, no acute acardiopulmonary process   ASSESSMENT/PLAN: Encounter Diagnoses  Name Primary?  . Generalized abdominal pain Yes  . Constipation, unspecified constipation type   . Hematuria    Labs pending Fu prn after labs Maybe renal related since she has hematuria Push fluids, bland liquid diet. Michaell Cowing.   Gross sideeffects, risk and benefits,  and alternatives of medications d/w patient. Patient is aware that all medications have potential sideeffects and we are unable  to predict every sideeffect or drug-drug interaction that may occur.  LE, THAO PHUONG, DO 09/02/2014 2:42 PM   09/10/2014- spoke with patient about labs. She is doing better. She finally had a large , hard stool oiver the eweekend, Still miniamlly nauseated but better. Back to regular diet. She still has difficulaty passing stool. Continue with miralax and senokot. She will call me and let me know if we need to get a CT scan if no improvement.

## 2014-09-02 NOTE — Patient Instructions (Signed)

## 2014-09-03 ENCOUNTER — Telehealth: Payer: Self-pay

## 2014-09-03 LAB — H. PYLORI BREATH TEST: H. pylori Breath Test: NOT DETECTED

## 2014-09-03 NOTE — Telephone Encounter (Signed)
Spoke with patient, she may advance diet, sheis having small BM and passing gas, feels better after passing flatus.

## 2014-09-03 NOTE — Telephone Encounter (Signed)
Pt states Dr.Le put her on a bland diet yesterday and would like to know if she can start eating regular foods and she also have more questions regarding the instructions she was given Please call [phone]

## 2014-09-03 NOTE — Telephone Encounter (Signed)
Left message for pt to call back. Advise on the diet.

## 2014-09-13 ENCOUNTER — Encounter: Payer: Self-pay | Admitting: Internal Medicine

## 2014-09-13 ENCOUNTER — Ambulatory Visit (INDEPENDENT_AMBULATORY_CARE_PROVIDER_SITE_OTHER): Payer: 59 | Admitting: Internal Medicine

## 2014-09-13 VITALS — BP 126/74 | HR 74 | Ht 67.0 in | Wt 221.7 lb

## 2014-09-13 DIAGNOSIS — J4540 Moderate persistent asthma, uncomplicated: Secondary | ICD-10-CM | POA: Diagnosis not present

## 2014-09-13 NOTE — Patient Instructions (Addendum)
Stop spiriva to see what difference if any it makes (like high octane fuel, it takes a while to appreciate benefit or lack thereof) - if worse resume 2 pffs each am   Work on maintaining perfect  inhaler technique:  relax and gently blow all the way out then take a nice smooth deep breath back in, triggering the inhaler at same time you start breathing in. Hold for up to 5 seconds if you can.   Blow out thru nose.  Rinse and gargle with water when done      Only use your albuterol as a rescue medication to be used if you can't catch your breath by resting or doing a relaxed purse lip breathing pattern.  - The less you use it, the better it will work when you need it. - Ok to use up to 2 puffs  every 4 hours if you must but call for immediate appointment if use goes up over your usual need - Don't leave home without it !!  (think of it like the spare tire for your car)   Be sure you take the prilosec 20 mg 30 min before supper    Please schedule a follow up visit in 3 months but call sooner if needed

## 2014-09-13 NOTE — Assessment & Plan Note (Addendum)
Follow by Dr Queen Slough, [place], until established in pulmonary clinic 05/10/14  - started on symbicort 80 2bid 06/26/14 > stopped  07/07/14   - PFTs  06/28/14 done during acute flare  FEV1  1.80 (73%) ratio 54 p 38% from bronchodilators and  Near nl dlco - 08/16/2014   try qvar 80 2bid  - 09/13/2014 p extensive coaching HFA effectiveness =    90%  - spirometry 09/13/2014   FEV1  1.72 (72%) ratio 63 s any bronchodilator    I had an extended discussion with the patient reviewing all relevant studies completed to date and  lasting 15 to 20 minutes of a 25 minute visit    1) All goals of chronic asthma control met including optimal function and elimination of symptoms with minimal need for rescue therapy.  2) Contingencies discussed in full including contacting this office immediately if not controlling the symptoms using the rule of two's.     3) Each maintenance medication was reviewed in detail including most importantly the difference between maintenance and prns and under what circumstances the prns are to be triggered using an action plan format that is not reflected in the computer generated alphabetically organized AVS.    Please see instructions for details which were reviewed in writing and the patient given a copy highlighting the part that I personally wrote and discussed at today's ov.

## 2014-09-13 NOTE — Progress Notes (Signed)
Subjective:     Patient ID: Kerri Johnson, female   DOB: November 14, 1955,   MRN: [id number]     Brief patient profile:  [age] yobf never smoker [place] with problems with asthma as long back as she can remember initially just prn rx for  intermittent problems then changed over maint rx since 1990s most recently under care of [facility] doctor = Awanda MinkJohn Bolt [place] on spiriva/ respimat daily (apparently failed advair) and [place] referred to pulmonary clinic 05/10/14     05/10/2014 1st [facility] office visit/ Wert   Chief Complaint  Patient presents with  . Advice Only    ref by Dr. Hamilton Caprihao Le; asthma/COPD; needs to est. care since moving here. no SOB/cough  bleach and cold weather cause flares, she's  not typically limited by breathing with 2 flares of asthma in last 6 months requiring proaire up to 3 x daily but not typically not needing any prednisone and no recent flares, happy with spiriva and no recent need for proair rec If not doing well with your reflux symptoms or breathing change prilosec to Take 30- 60 min before your first and last meals of the day  GERD  Diet  Please schedule a follow up visit in 3 months but call sooner if needed.     06/26/2014 f/u ov/Wert re: copd vs chronic  asthma/ maint rx with spiriva x 3 years per Dr Donnalee CurryBoldt (pulmonary)  Chief Complaint  Patient presents with  . Acute Visit    Pt c/o increased SOB for the past 5 days- started after they installed a new bathroom in her home. She is also wheezing and having chest tightness. She is using rescue inhaler 4-6 x per day.   using proaire respiclick/ severe hoarseness / did not activate "action plan"  rec Plan A = Automatic  = symbicort 80 Take 2 puffs first thing in am and then another 2 puffs about 12 hours later and continue spiriva for now as well               Pantoprazole (protonix) 40 mg   Take 30-60 min before first meal of the day  and prilosec 20 mg 30 min before supper Plan B  Only use your albutero(proair)  As rescue  Plan C = contingency> Prednisone 10 mg take  4 each am x 2 days,   2 each am x 2 days,  1 each am x 2 days and stop  GERD diet      08/16/2014 f/u ov/Wert re: chronic asthma / undertreated inflammatory component on spiriva 1.25 mg only  Chief Complaint  Patient presents with  . Follow-up    Pt c/o wheezing, sore throat, dry cough. Slight chest congestion and PND. Pt states she no longer takes symbicort due to chest pain and rapid heart rate.   could not tolerate symbicort more than a week  Thinks staying out of bad part of house is what really helps her breathing / rarely feeling need for saba/ then  worse x 3 days with sore throat but no fever, denies missing doses of ppi/h2hs  rec Plan A = Automatic  = Qvar  80 Take 2 puffs first thing in am and then another 2 puffs about 12 hours later and continue spiriva for now as well               Pantoprazole (protonix) 40 mg  Take 30-60 min before first meal of the day and prilosec 20 mg 30 min before supper for now Plan B  Only use your albutero(proair)  as a rescue medication       09/13/2014 f/u ov/Wert re: asthma  Chief Complaint  Patient presents with  . Follow-up    Pt states her breathing has improved back to baseline. Her cough and wheezing has resolved and she has not used rescue inhaler.    mild sob with heavy ex only having tapered spiriva to one puff q am and still on qvar 2 bid s need for rescue though hfa poor (see a/p)  Not using ppi ac as rec    No obvious day to day or daytime variabilty or assoc excess/ purulent mucus or cp or   overt sinus or hb symptoms. No other  unusual exp hx or h/o childhood pna/ asthma or knowledge of premature birth.  Sleeping ok without nocturnal  or early am exacerbation  of respiratory  c/o's or need for noct saba. Also denies any obvious fluctuation of symptoms with weather or environmental changes or  other aggravating or alleviating factors except as outlined above   Current Medications, Allergies, Complete Past Medical History, Past Surgical History, Family History, and Social History were reviewed in [employer] record.  ROS  The following are not active complaints unless bolded sore throat, dysphagia, dental problems, itching, sneezing,  nasal congestion or excess/ purulent secretions, ear ache,   fever, chills, sweats, unintended wt loss, pleuritic or exertional cp, hemoptysis,  orthopnea pnd or leg swelling, presyncope, palpitations, heartburn, abdominal pain, anorexia, nausea, vomiting, diarrhea  or change in bowel or urinary habits, change in stools or urine, dysuria,hematuria,  rash, arthralgias, visual complaints, headache, numbness weakness or ataxia or problems with walking or coordination,  change in mood/affect or memory.             Objective:   Physical Exam  amb obese  bf nad  06/26/2014        222 >  09/13/2014 222   Wt Readings from Last 3 Encounters:  05/10/14 232 lb (105.235 kg)  04/29/14 227 lb (102.967 kg)  12/20/13 218 lb (98.884 kg)    Vital signs reviewed   HEENT: nl dentition, turbinates, and orophanx. Nl external ear canals without cough reflex   NECK :  without JVD/Nodes/TM/ nl carotid upstrokes bilaterally   LUNGS: no acc muscle use, clear to A and P bilaterally without cough on insp or exp maneuvers   CV:  RRR  no s3 or murmur or increase in P2, no edema   ABD:  soft and nontender with nl excursion in the supine position. No bruits or organomegaly, bowel sounds nl  MS:  warm without deformities, calf tenderness, cyanosis or clubbing  SKIN: warm and dry without lesions    NEURO:  alert, approp, no deficits   CXR PA and Lateral:   05/10/2014 :     I personally reviewed images and agree with radiology impression as follows:      Hyperinflation consistent with known reactive airway disease. There is no pneumonia nor other  acute cardiopulmonary abnormality.      Assessment:

## 2014-11-04 ENCOUNTER — Telehealth: Payer: Self-pay | Admitting: Internal Medicine

## 2014-11-04 MED ORDER — BECLOMETHASONE DIPROPIONATE 80 MCG/ACT IN AERS: INHALATION_SPRAY | RESPIRATORY_TRACT | Status: DC

## 2014-11-04 NOTE — Telephone Encounter (Signed)
Spoke with pt and advised that refill for Qvar was sent to pharmacy.

## 2014-12-11 ENCOUNTER — Telehealth: Payer: Self-pay | Admitting: Internal Medicine

## 2014-12-11 NOTE — Telephone Encounter (Signed)
Pt states at her previous colon in [place] she had an adenomatous polyp removed. States that it was on the right side and now she is having abdominal discomfort in that area and some bloating/swelling in that area. Pt states it is even tender to lay on that side.  Pt scheduled to see Amy Esterwood PA 12/16/14@1 :30pm. Pt aware of appt.

## 2014-12-16 ENCOUNTER — Ambulatory Visit (INDEPENDENT_AMBULATORY_CARE_PROVIDER_SITE_OTHER): Payer: 59 | Admitting: Physician Assistant

## 2014-12-16 ENCOUNTER — Other Ambulatory Visit (INDEPENDENT_AMBULATORY_CARE_PROVIDER_SITE_OTHER): Payer: 59

## 2014-12-16 ENCOUNTER — Encounter: Payer: Self-pay | Admitting: Physician Assistant

## 2014-12-16 VITALS — BP 142/86 | HR 70 | Ht 67.0 in | Wt 226.4 lb

## 2014-12-16 DIAGNOSIS — R140 Abdominal distension (gaseous): Secondary | ICD-10-CM | POA: Diagnosis not present

## 2014-12-16 DIAGNOSIS — R1011 Right upper quadrant pain: Secondary | ICD-10-CM | POA: Diagnosis not present

## 2014-12-16 DIAGNOSIS — R110 Nausea: Secondary | ICD-10-CM | POA: Diagnosis not present

## 2014-12-16 LAB — CBC WITH DIFFERENTIAL/PLATELET
Basophils Absolute: 0 10*3/uL (ref 0.0–0.1)
Basophils Relative: 0.6 % (ref 0.0–3.0)
Eosinophils Absolute: 0.1 10*3/uL (ref 0.0–0.7)
Eosinophils Relative: 1.6 % (ref 0.0–5.0)
HCT: 40.3 % (ref 36.0–46.0)
Hemoglobin: 13.3 g/dL (ref 12.0–15.0)
Lymphocytes Relative: 31.4 % (ref 12.0–46.0)
Lymphs Abs: 2 10*3/uL (ref 0.7–4.0)
MCHC: 33 g/dL (ref 30.0–36.0)
MCV: 87.5 fl (ref 78.0–100.0)
Monocytes Absolute: 0.4 10*3/uL (ref 0.1–1.0)
Monocytes Relative: 6.4 % (ref 3.0–12.0)
Neutro Abs: 3.8 10*3/uL (ref 1.4–7.7)
Neutrophils Relative %: 60 % (ref 43.0–77.0)
Platelets: 244 10*3/uL (ref 150.0–400.0)
RBC: 4.61 Mil/uL (ref 3.87–5.11)
RDW: 13.8 % (ref 11.5–15.5)
WBC: 6.4 10*3/uL (ref 4.0–10.5)

## 2014-12-16 LAB — COMPREHENSIVE METABOLIC PANEL
ALT: 41 U/L — ABNORMAL HIGH (ref 0–35)
AST: 23 U/L (ref 0–37)
Albumin: 4.3 g/dL (ref 3.5–5.2)
Alkaline Phosphatase: 78 U/L (ref 39–117)
BUN: 15 mg/dL (ref 6–23)
CO2: 32 mEq/L (ref 19–32)
Calcium: 9.7 mg/dL (ref 8.4–10.5)
Chloride: 104 mEq/L (ref 96–112)
Creatinine, Ser: 0.9 mg/dL (ref 0.40–1.20)
GFR: 82.98 mL/min (ref >60.00)
Glucose, Bld: 104 mg/dL — ABNORMAL HIGH (ref 70–99)
Potassium: 3.9 mEq/L (ref 3.5–5.1)
Sodium: 141 mEq/L (ref 135–145)
Total Bilirubin: 0.6 mg/dL (ref 0.2–1.2)
Total Protein: 7.9 g/dL (ref 6.0–8.3)

## 2014-12-16 LAB — LIPASE: Lipase: 46 U/L (ref 11.0–59.0)

## 2014-12-16 MED ORDER — ONDANSETRON HCL 4 MG PO TABS: 4.0000 mg | ORAL_TABLET | Freq: Four times a day (QID) | ORAL | Status: DC | PRN

## 2014-12-16 NOTE — Progress Notes (Addendum)
Patient ID: Kerri Johnson, female   DOB: 10-Feb-1956, 59 y.o.   MRN: [id number]   Subjective:    Patient ID: Kerri Johnson, female    DOB: 08-Apr-1955, 59 y.o.   MRN: [id number]  HPI  Kerri Johnson is a pleasant 59 year old African-American female known to Dr.Pyrtle. She had undergone colonoscopy in October 2015 with finding of a redundant colon but no polyps. She has prior history of an adenomatous polyp and therefore was asked to follow-up in 5 years. She comes in today with complaints of recent right upper quadrant discomfort and crampy pain across her upper abdomen., also concerned about a fullness in her right upper quadrant and feels that her abdomen is "puffed out" on that side. She also has felt 2 small nodules under her skin on the right side of her abdomen which she's only been aware of over the past couple of weeks. She says the right upper quadrant discomfort is dull in nature but is always present seems to be a little bit better after bowel movements. She says now immediately after she eats she gets bloating and nausea   . She is on Protonix daily which she usually takes with dinner because of evening symptoms.  Review of Systems Pertinent positive and negative review of systems were noted in the above HPI section.  All other review of systems was otherwise negative.  Outpatient Encounter Prescriptions as of 12/16/2014  Medication Sig  . acetaminophen (TYLENOL) 325 MG tablet Take 650 mg by mouth every 6 (six) hours as needed.  Marland Kitchen albuterol (PROAIR HFA) 108 (90 BASE) MCG/ACT inhaler 2 puffs up to every 4 hours if can't catch your breath  . beclomethasone (QVAR) 80 MCG/ACT inhaler Take 2 puffs first thing in am and then another 2 puffs about 12 hours later.  Marland Kitchen omeprazole (PRILOSEC) 20 MG capsule Take 1 capsule (20 mg total) by mouth daily. (Patient taking differently: Take 20 mg by mouth at bedtime. )  . pantoprazole (PROTONIX) 40 MG tablet Take 1 tablet (40 mg total) by mouth daily. Take 30-60 min  before first meal of the day  . ondansetron (ZOFRAN) 4 MG tablet Take 1 tablet (4 mg total) by mouth every 6 (six) hours as needed for nausea or vomiting.   No facility-administered encounter medications on file as of 12/16/2014.   Allergies  Allergen Reactions  . Iodine   . Peanuts [Peanut Oil]   . Shellfish Allergy   . Versed [Midazolam]    Patient Active Problem List   Diagnosis Date Noted  . Moderate persistent allergic asthma without complication 05/10/2014  . Chest pain of uncertain etiology 11/28/2013  . Personal history of colonic polyps 11/16/2013  . GERD (gastroesophageal reflux disease) 11/16/2013  . HTN (hypertension) 11/16/2013   Social History   Social History  . Marital Status: Married    Spouse Name: N/A  . Number of Children: 3  . Years of Education: N/A   Occupational History  . Not on file.   Social History Main Topics  . Smoking status: Never Smoker   . Smokeless tobacco: Never Used  . Alcohol Use: No  . Drug Use: No  . Sexual Activity: Not on file   Other Topics Concern  . Not on file   Social History Narrative    Kerri Johnson's family history includes Diabetes in her mother, sister, and sister; Heart disease in her mother; Hypertension in her mother and sister.      Objective:    Filed Vitals:  12/16/14 1344  BP: 142/86  Pulse: 70    Physical Exam  well-developed older African-American female in no acute distress, pleasant blood pressure 142/86 pulse 70 height 5 foot 7 weight 226 of a BMI 35. HEENT; nontraumatic normocephalic EOMI PERRLA sclera anicteric, neck there is fullness of the thyroid no definite nodule, Cardiovascular ;regular rate and rhythm with S1-S2 no murmur or gallop,, Pulmonary; clear bilaterally, Abdomen; obese soft she is tender in the right upper quadrant and epigastrium no palpable mass or hepatosplenomegaly has a very small subcutaneous nodule palpable in the right upper quadrant- question lipoma Rectal; exam not  done, Extremities ;no clubbing cyanosis or edema skin warm and dry, Neuropsych; mood and affect appropriate       Assessment & Plan:   #1  59 yo female with few month hx of RUQ and epigastric discomfort with post prandial nausea and bloating- etiology not clear - r/o GB disease ,other intraabdominal inflammatory process,PUD #2  2 very small subcutaneous nodules palpable- likely lipomas #3 hx of adenomatous polyps- negative colon 10/15 #4 GERD #5 HTN  Plan; CBC,CMET,lipase  Ct abd/pelvis- may need  [place], and EGD if CT otherwise unrevealing Zofran 4 mg po q 6 hours prn  Continue Omeprazole 20 mg po daily ac dinner Zofran      Arletha Marschke S Gearline Spilman PA-C 12/16/2014   Cc: No ref. provider found  Addendum: Reviewed and agree with initial management. Beverley Fiedler, MD

## 2014-12-16 NOTE — Patient Instructions (Addendum)
Your physician has requested that you go to the basement for the following lab work before leaving today:  You have been scheduled for a CT scan of the abdomen and pelvis at [facility] ([address]---this is in the same building as [facility]).   You are scheduled on  10-14-196 at 11:00am. You should arrive 15 minutes prior to your appointment time for registration. Please follow the written instructions below on the day of your exam:  WARNING: IF YOU ARE ALLERGIC TO IODINE/X-RAY DYE, PLEASE NOTIFY RADIOLOGY IMMEDIATELY AT [phone]! YOU WILL BE GIVEN A 13 HOUR PREMEDICATION PREP.  1) Do not eat or drink anything after (4 hours prior to your test) 2) You have been given 2 bottles of oral contrast to drink. The solution may taste better if refrigerated, but do NOT add ice or any other liquid to this solution. Shake well before drinking.    Drink 1 bottle of contrast @ 9:00am (2 hours prior to your exam)  Drink 1 bottle of contrast @ 10:00am (1 hour prior to your exam)  You may take any medications as prescribed with a small amount of water except for the following: Metformin, Glucophage, Glucovance, Avandamet, Riomet, Fortamet, Actoplus Met, Janumet, Glumetza or Metaglip. The above medications must be held the day of the exam AND 48 hours after the exam.  The purpose of you drinking the oral contrast is to aid in the visualization of your intestinal tract. The contrast solution may cause some diarrhea. Before your exam is started, you will be given a small amount of fluid to drink. Depending on your individual set of symptoms, you may also receive an intravenous injection of x-ray contrast/dye. Plan on being at [facility] for 30 minutes or long, depending on the type of exam you are having performed.  This test typically takes 30-45 minutes to complete.  If you have any questions regarding your exam or if you need to reschedule, you may call the CT department  at [phone] between the hours of 8:00 am and 5:00 pm, Monday-Friday.  ________________________________________________________________________   [facility] [place], [place], [place] [address]  [phone]) [place] [facility] [place] [facility]  [facility]  Address: [address] [place], [place], [place] [address]  [phone]  Please contact your old or new primary care provider to discuss your Thyroid  Please continue taking omeprazole 49m daily  We have sent the following medications to your pharmacy for you to pick up at your convenience: Zofran

## 2014-12-19 ENCOUNTER — Other Ambulatory Visit: Payer: Self-pay

## 2014-12-19 DIAGNOSIS — R945 Abnormal results of liver function studies: Secondary | ICD-10-CM

## 2014-12-20 ENCOUNTER — Other Ambulatory Visit: Payer: 59

## 2014-12-20 ENCOUNTER — Ambulatory Visit (INDEPENDENT_AMBULATORY_CARE_PROVIDER_SITE_OTHER)
Admission: RE | Admit: 2014-12-20 | Discharge: 2014-12-20 | Disposition: A | Discharge status: Home / Self Care | Payer: 59 | Source: Ambulatory Visit | Attending: Physician Assistant | Admitting: Physician Assistant

## 2014-12-20 DIAGNOSIS — R1011 Right upper quadrant pain: Secondary | ICD-10-CM | POA: Diagnosis not present

## 2014-12-20 DIAGNOSIS — R110 Nausea: Secondary | ICD-10-CM | POA: Diagnosis not present

## 2014-12-20 DIAGNOSIS — R945 Abnormal results of liver function studies: Secondary | ICD-10-CM

## 2014-12-21 LAB — HEPATITIS C ANTIBODY: HCV Ab: NEGATIVE

## 2015-04-20 ENCOUNTER — Emergency Department (HOSPITAL_COMMUNITY): Payer: 59

## 2015-04-20 ENCOUNTER — Emergency Department (HOSPITAL_COMMUNITY)
Admission: EM | Admit: 2015-04-20 | Discharge: 2015-04-20 | Disposition: A | Discharge status: Home / Self Care | Payer: 59 | Attending: Emergency Medicine | Admitting: Emergency Medicine

## 2015-04-20 ENCOUNTER — Encounter (HOSPITAL_COMMUNITY): Payer: Self-pay

## 2015-04-20 DIAGNOSIS — I10 Essential (primary) hypertension: Secondary | ICD-10-CM | POA: Insufficient documentation

## 2015-04-20 DIAGNOSIS — Z79899 Other long term (current) drug therapy: Secondary | ICD-10-CM | POA: Diagnosis not present

## 2015-04-20 DIAGNOSIS — R079 Chest pain, unspecified: Secondary | ICD-10-CM | POA: Insufficient documentation

## 2015-04-20 DIAGNOSIS — K219 Gastro-esophageal reflux disease without esophagitis: Secondary | ICD-10-CM | POA: Diagnosis not present

## 2015-04-20 DIAGNOSIS — J45909 Unspecified asthma, uncomplicated: Secondary | ICD-10-CM | POA: Diagnosis not present

## 2015-04-20 DIAGNOSIS — Z86010 Personal history of colonic polyps: Secondary | ICD-10-CM | POA: Diagnosis not present

## 2015-04-20 DIAGNOSIS — R42 Dizziness and giddiness: Secondary | ICD-10-CM

## 2015-04-20 DIAGNOSIS — Z8639 Personal history of other endocrine, nutritional and metabolic disease: Secondary | ICD-10-CM | POA: Insufficient documentation

## 2015-04-20 LAB — BASIC METABOLIC PANEL
Anion gap: 7 (ref 5–15)
BUN: 14 mg/dL (ref 6–20)
CO2: 26 mmol/L (ref 22–32)
Calcium: 9.6 mg/dL (ref 8.9–10.3)
Chloride: 105 mmol/L (ref 101–111)
Creatinine, Ser: 0.86 mg/dL (ref 0.44–1.00)
GFR calc Af Amer: >60 mL/min (ref >60)
GFR calc non Af Amer: >60 mL/min (ref >60)
Glucose, Bld: 79 mg/dL (ref 65–99)
Potassium: 4 mmol/L (ref 3.5–5.1)
Sodium: 138 mmol/L (ref 135–145)

## 2015-04-20 LAB — CBC
HCT: 41.4 % (ref 36.0–46.0)
Hemoglobin: 13.1 g/dL (ref 12.0–15.0)
MCH: 28.4 pg (ref 26.0–34.0)
MCHC: 31.6 g/dL (ref 30.0–36.0)
MCV: 89.8 fL (ref 78.0–100.0)
Platelets: 245 10*3/uL (ref 150–400)
RBC: 4.61 MIL/uL (ref 3.87–5.11)
RDW: 13.5 % (ref 11.5–15.5)
WBC: 5 10*3/uL (ref 4.0–10.5)

## 2015-04-20 LAB — I-STAT TROPONIN, ED: Troponin i, poc: 0 ng/mL (ref 0.00–0.08)

## 2015-04-20 MED ORDER — ONDANSETRON 8 MG PO TBDP
8.0000 mg | ORAL_TABLET | Freq: Once | ORAL | Status: AC
  Administered 2015-04-20: 8 mg via ORAL
  Filled 2015-04-20: qty 1

## 2015-04-20 MED ORDER — MECLIZINE HCL 25 MG PO TABS: 25.0000 mg | ORAL_TABLET | Freq: Three times a day (TID) | ORAL | Status: DC | PRN

## 2015-04-20 MED ORDER — MECLIZINE HCL 25 MG PO TABS
25.0000 mg | ORAL_TABLET | Freq: Once | ORAL | Status: AC
  Administered 2015-04-20: 25 mg via ORAL
  Filled 2015-04-20: qty 1

## 2015-04-20 MED ORDER — DIAZEPAM 2 MG PO TABS: 2.0000 mg | ORAL_TABLET | Freq: Two times a day (BID) | ORAL | Status: DC | PRN

## 2015-04-20 MED ORDER — LORAZEPAM 0.5 MG PO TABS
0.5000 mg | ORAL_TABLET | Freq: Once | ORAL | Status: AC
  Administered 2015-04-20: 0.5 mg via ORAL
  Filled 2015-04-20: qty 1

## 2015-04-20 NOTE — ED Notes (Signed)
Pt ambulated without difficulty and feels much less dizzy than when she arrived. MD made aware.

## 2015-04-20 NOTE — Discharge Instructions (Signed)
Dizziness °Dizziness is a common problem. It is a feeling of unsteadiness or light-headedness. You may feel like you are about to faint. Dizziness can lead to injury if you stumble or fall. Anyone can become dizzy, but dizziness is more common in older adults. This condition can be caused by a number of things, including medicines, dehydration, or illness. °HOME CARE INSTRUCTIONS °Taking these steps may help with your condition: °Eating and Drinking °· Drink enough fluid to keep your urine clear or pale yellow. This helps to keep you from becoming dehydrated. Try to drink more clear fluids, such as water. °· Do not drink alcohol. °· Limit your caffeine intake if directed by your health care provider. °· Limit your salt intake if directed by your health care provider. °Activity °· Avoid making quick movements. °· Rise slowly from chairs and steady yourself until you feel okay. °· In the morning, first sit up on the side of the bed. When you feel okay, stand slowly while you hold onto something until you know that your balance is fine. °· Move your legs often if you need to stand in one place for a long time. Tighten and relax your muscles in your legs while you are standing. °· Do not drive or operate heavy machinery if you feel dizzy. °· Avoid bending down if you feel dizzy. Place items in your home so that they are easy for you to reach without leaning over. °Lifestyle °· Do not use any tobacco products, including cigarettes, chewing tobacco, or electronic cigarettes. If you need help quitting, ask your health care provider. °· Try to reduce your stress level, such as with yoga or meditation. Talk with your health care provider if you need help. °General Instructions °· Watch your dizziness for any changes. °· Take medicines only as directed by your health care provider. Talk with your health care provider if you think that your dizziness is caused by a medicine that you are taking. °· Tell a friend or a family  member that you are feeling dizzy. If he or she notices any changes in your behavior, have this person call your health care provider. °· Keep all follow-up visits as directed by your health care provider. This is important. °SEEK MEDICAL CARE IF: °· Your dizziness does not go away. °· Your dizziness or light-headedness gets worse. °· You feel nauseous. °· You have reduced hearing. °· You have new symptoms. °· You are unsteady on your feet or you feel like the room is spinning. °SEEK IMMEDIATE MEDICAL CARE IF: °· You vomit or have diarrhea and are unable to eat or drink anything. °· You have problems talking, walking, swallowing, or using your arms, hands, or legs. °· You feel generally weak. °· You are not thinking clearly or you have trouble forming sentences. It may take a friend or family member to notice this. °· You have chest pain, abdominal pain, shortness of breath, or sweating. °· Your vision changes. °· You notice any bleeding. °· You have a headache. °· You have neck pain or a stiff neck. °· You have a fever. °  °This information is not intended to replace advice given to you by your health care provider. Make sure you discuss any questions you have with your health care provider. °  °Document Released: 08/18/2000 Document Revised: 07/09/2014 Document Reviewed: 02/18/2014 °Elsevier Interactive Patient Education ©2016 Elsevier Inc[employer] ° °Benign Positional Vertigo °Vertigo is the feeling that you or your surroundings are moving when they are not.   Benign positional vertigo is the most common form of vertigo. The cause of this condition is not serious (is benign). This condition is triggered by certain movements and positions (is positional). This condition can be dangerous if it occurs while you are doing something that could endanger you or others, such as driving.  °CAUSES °In many cases, the cause of this condition is not known. It may be caused by a disturbance in an area of the inner ear that helps your  brain to sense movement and balance. This disturbance can be caused by a viral infection (labyrinthitis), head injury, or repetitive motion. °RISK FACTORS °This condition is more likely to develop in: °· Women. °· People who are 50 years of age or older. °SYMPTOMS °Symptoms of this condition usually happen when you move your head or your eyes in different directions. Symptoms may start suddenly, and they usually last for less than a minute. Symptoms may include: °· Loss of balance and falling. °· Feeling like you are spinning or moving. °· Feeling like your surroundings are spinning or moving. °· Nausea and vomiting. °· Blurred vision. °· Dizziness. °· Involuntary eye movement (nystagmus). °Symptoms can be mild and cause only slight annoyance, or they can be severe and interfere with daily life. Episodes of benign positional vertigo may return (recur) over time, and they may be triggered by certain movements. Symptoms may improve over time. °DIAGNOSIS °This condition is usually diagnosed by medical history and a physical exam of the head, neck, and ears. You may be referred to a health care provider who specializes in ear, nose, and throat (ENT) problems (otolaryngologist) or a provider who specializes in disorders of the nervous system (neurologist). You may have additional testing, including: °· MRI. °· A CT scan. °· Eye movement tests. Your health care provider may ask you to change positions quickly while he or she watches you for symptoms of benign positional vertigo, such as nystagmus. Eye movement may be tested with an electronystagmogram (ENG), caloric stimulation, the Dix-Hallpike test, or the roll test. °· An electroencephalogram (EEG). This records electrical activity in your brain. °· Hearing tests. °TREATMENT °Usually, your health care provider will treat this by moving your head in specific positions to adjust your inner ear back to normal. Surgery may be needed in severe cases, but this is rare. In  some cases, benign positional vertigo may resolve on its own in 2-4 weeks. °HOME CARE INSTRUCTIONS °Safety °· Move slowly. Avoid sudden body or head movements. °· Avoid driving. °· Avoid operating heavy machinery. °· Avoid doing any tasks that would be dangerous to you or others if a vertigo episode would occur. °· If you have trouble walking or keeping your balance, try using a cane for stability. If you feel dizzy or unstable, sit down right away. °· Return to your normal activities as told by your health care provider. Ask your health care provider what activities are safe for you. °General Instructions °· Take over-the-counter and prescription medicines only as told by your health care provider. °· Avoid certain positions or movements as told by your health care provider. °· Drink enough fluid to keep your urine clear or pale yellow. °· Keep all follow-up visits as told by your health care provider. This is important. °SEEK MEDICAL CARE IF: °· You have a fever. °· Your condition gets worse or you develop new symptoms. °· Your family or friends notice any behavioral changes. °· Your nausea or vomiting gets worse. °· You have numbness or a "  pins and needles" sensation. °SEEK IMMEDIATE MEDICAL CARE IF: °· You have difficulty speaking or moving. °· You are always dizzy. °· You faint. °· You develop severe headaches. °· You have weakness in your legs or arms. °· You have changes in your hearing or vision. °· You develop a stiff neck. °· You develop sensitivity to light. °  °This information is not intended to replace advice given to you by your health care provider. Make sure you discuss any questions you have with your health care provider. °  °Document Released: 11/30/2005 Document Revised: 11/13/2014 Document Reviewed: 06/17/2014 °Elsevier Interactive Patient Education ©2016 Elsevier Inc. ° °

## 2015-04-20 NOTE — ED Notes (Signed)
Patient was alert, oriented and stable upon discharge. RN went over AVS and patient had no further questions.  

## 2015-04-20 NOTE — ED Provider Notes (Signed)
CSN: [id number]     Arrival date & time 04/20/15  1304 History   First MD Initiated Contact with Patient 04/20/15 1559     Chief Complaint  Patient presents with  . Chest Pain  . Dizziness     (Consider location/radiation/quality/duration/timing/severity/associated sxs/prior Treatment) HPI Comments: Patient here complaining of dizziness 3 days described as room spinning and worse with certain positions. No associated headache but has had some nausea and vomiting. History of vertigo and this feels slightly different. Denies any fever or chills. Had one episode of left-sided sharp chest pain was last for 1-2 seconds without associated anginal features. No recurrence of those symptoms. Denies any palpitations or syncope or near syncope. Patient attempted to use Epley maneuvers without success. Denies any focal weakness at this time.  Patient is a 60 y.o. female presenting with chest pain and dizziness. The history is provided by the patient and the spouse.  Chest Pain Associated symptoms: dizziness   Dizziness Associated symptoms: chest pain     Past Medical History  Diagnosis Date  . Asthma   . GERD (gastroesophageal reflux disease)   . Colon polyps   . Hypertension   . Thyroid nodule     dx 2009   Past Surgical History  Procedure Laterality Date  . Cesarean section    . Abdominal hysterectomy    . Thyroidectomy, partial     Family History  Problem Relation Age of Onset  . Diabetes Mother   . Heart disease Mother   . Hypertension Mother   . Diabetes Sister   . Hypertension Sister   . Diabetes Sister    Social History  Substance Use Topics  . Smoking status: Never Smoker   . Smokeless tobacco: Never Used  . Alcohol Use: No   OB History    No data available     Review of Systems  Cardiovascular: Positive for chest pain.  Neurological: Positive for dizziness.  All other systems reviewed and are negative.     Allergies  Iodine; Peanuts; Shellfish allergy; and  Versed  Home Medications   Prior to Admission medications   Medication Sig Start Date End Date Taking? Authorizing Provider  acetaminophen (TYLENOL) 325 MG tablet Take 650 mg by mouth every 6 (six) hours as needed.    Historical Provider, MD  albuterol (PROAIR HFA) 108 (90 BASE) MCG/ACT inhaler 2 puffs up to every 4 hours if can't catch your breath 06/26/14   Nyoka Cowden, MD  beclomethasone (QVAR) 80 MCG/ACT inhaler Take 2 puffs first thing in am and then another 2 puffs about 12 hours later. 11/04/14   Nyoka Cowden, MD  omeprazole (PRILOSEC) 20 MG capsule Take 1 capsule (20 mg total) by mouth daily. Patient taking differently: Take 20 mg by mouth at bedtime.  11/23/13   Thao P Le, DO  ondansetron (ZOFRAN) 4 MG tablet Take 1 tablet (4 mg total) by mouth every 6 (six) hours as needed for nausea or vomiting. 12/16/14   Amy S Esterwood, PA-C  pantoprazole (PROTONIX) 40 MG tablet Take 1 tablet (40 mg total) by mouth daily. Take 30-60 min before first meal of the day 06/26/14   Nyoka Cowden, MD   BP 138/93 mmHg  Pulse 68  Temp(Src) 98.2 F (36.8 C) (Oral)  Resp 11  SpO2 98% Physical Exam  Constitutional: She is oriented to person, place, and time. She appears well-developed and well-nourished.  Non-toxic appearance. No distress.  HENT:  Head: Normocephalic and atraumatic.  Eyes: Conjunctivae, EOM and lids are normal. Pupils are equal, round, and reactive to light.  Neck: Normal range of motion. Neck supple. No tracheal deviation present. No thyroid mass present.  Cardiovascular: Normal rate, regular rhythm and normal heart sounds.  Exam reveals no gallop.   No murmur heard. Pulmonary/Chest: Effort normal and breath sounds normal. No stridor. No respiratory distress. She has no decreased breath sounds. She has no wheezes. She has no rhonchi. She has no rales.  Abdominal: Soft. Normal appearance and bowel sounds are normal. She exhibits no distension. There is no tenderness. There is no  rebound and no CVA tenderness.  Musculoskeletal: Normal range of motion. She exhibits no edema or tenderness.  Neurological: She is alert and oriented to person, place, and time. She displays no tremor. No cranial nerve deficit or sensory deficit. GCS eye subscore is 4. GCS verbal subscore is 5. GCS motor subscore is 6.  Skin: Skin is warm and dry. No abrasion and no rash noted.  Psychiatric: She has a normal mood and affect. Her speech is normal and behavior is normal.  Nursing note and vitals reviewed.   ED Course  Procedures (including critical care time) Labs Review Labs Reviewed  BASIC METABOLIC PANEL  CBC  I-STAT TROPOININ, ED    Imaging Review Dg Chest 2 View  04/20/2015  CLINICAL DATA:  Left chest pain x2 days EXAM: CHEST  2 VIEW COMPARISON:  09/02/2014 FINDINGS: Lungs are clear.  No pleural effusion or pneumothorax. The heart is normal in size. Degenerative changes of the visualized thoracolumbar spine. IMPRESSION: No evidence of acute cardiopulmonary disease. Electronically Signed   By: Charline Bills M.D.   On: 04/20/2015 14:27   I have personally reviewed and evaluated these images and lab results as part of my medical decision-making.   EKG Interpretation   Date/Time:  Sunday April 20 2015 13:14:56 EST Ventricular Rate:  69 PR Interval:  178 QRS Duration: 85 QT Interval:  371 QTC Calculation: 397 R Axis:   44 Text Interpretation:  Sinus rhythm Anteroseptal infarct, old Confirmed by  Avry Monteleone  MD, Avant Printy ([id number]) on 04/20/2015 4:00:02 PM      MDM   Final diagnoses:  None    Patient given medications for likely peripheral vertigo.. She is able to ambulate and not require assistance. Do not think that this is central vertigo. Will discharge on meds and return precautions given    Lorre Nick, MD 04/20/15 1728

## 2015-04-20 NOTE — ED Notes (Addendum)
MD at bedside for reeval

## 2015-04-20 NOTE — ED Notes (Signed)
Pt states dizziness x 3 days.  Sharp chest pain starting yesterday.  Non radiating.  No shortness of breath.  Pain pointed to left under breast to back area

## 2015-06-02 ENCOUNTER — Other Ambulatory Visit: Payer: Self-pay | Admitting: Internal Medicine

## 2015-06-02 DIAGNOSIS — Z1231 Encounter for screening mammogram for malignant neoplasm of breast: Secondary | ICD-10-CM

## 2015-06-02 DIAGNOSIS — E041 Nontoxic single thyroid nodule: Secondary | ICD-10-CM

## 2015-06-06 ENCOUNTER — Ambulatory Visit
Admission: RE | Admit: 2015-06-06 | Discharge: 2015-06-06 | Disposition: A | Discharge status: Home / Self Care | Payer: 59 | Source: Ambulatory Visit | Attending: Internal Medicine | Admitting: Internal Medicine

## 2015-06-06 ENCOUNTER — Ambulatory Visit: Payer: 59

## 2015-06-06 DIAGNOSIS — E041 Nontoxic single thyroid nodule: Secondary | ICD-10-CM

## 2015-06-09 ENCOUNTER — Ambulatory Visit
Admission: RE | Admit: 2015-06-09 | Discharge: 2015-06-09 | Disposition: A | Discharge status: Home / Self Care | Payer: BLUE CROSS/BLUE SHIELD | Source: Ambulatory Visit | Attending: Internal Medicine | Admitting: Internal Medicine

## 2015-06-09 DIAGNOSIS — Z1231 Encounter for screening mammogram for malignant neoplasm of breast: Secondary | ICD-10-CM | POA: Diagnosis not present

## 2015-06-11 ENCOUNTER — Other Ambulatory Visit: Payer: Self-pay | Admitting: Internal Medicine

## 2015-06-11 DIAGNOSIS — R928 Other abnormal and inconclusive findings on diagnostic imaging of breast: Secondary | ICD-10-CM

## 2015-06-18 ENCOUNTER — Other Ambulatory Visit: Payer: BLUE CROSS/BLUE SHIELD

## 2015-06-18 ENCOUNTER — Ambulatory Visit
Admission: RE | Admit: 2015-06-18 | Discharge: 2015-06-18 | Disposition: A | Discharge status: Home / Self Care | Payer: BLUE CROSS/BLUE SHIELD | Source: Ambulatory Visit | Attending: Internal Medicine | Admitting: Internal Medicine

## 2015-06-18 DIAGNOSIS — R928 Other abnormal and inconclusive findings on diagnostic imaging of breast: Secondary | ICD-10-CM

## 2015-06-18 DIAGNOSIS — N6489 Other specified disorders of breast: Secondary | ICD-10-CM | POA: Diagnosis not present

## 2015-06-24 ENCOUNTER — Ambulatory Visit: Payer: 59

## 2015-06-26 DIAGNOSIS — N6042 Mammary duct ectasia of left breast: Secondary | ICD-10-CM | POA: Diagnosis not present

## 2015-07-21 ENCOUNTER — Ambulatory Visit: Payer: BLUE CROSS/BLUE SHIELD | Admitting: Internal Medicine

## 2015-07-28 DIAGNOSIS — N6042 Mammary duct ectasia of left breast: Secondary | ICD-10-CM | POA: Diagnosis not present

## 2015-09-04 ENCOUNTER — Ambulatory Visit
Admission: RE | Admit: 2015-09-04 | Discharge: 2015-09-04 | Disposition: A | Discharge status: Home / Self Care | Payer: BLUE CROSS/BLUE SHIELD | Source: Ambulatory Visit | Attending: Internal Medicine | Admitting: Internal Medicine

## 2015-09-04 ENCOUNTER — Other Ambulatory Visit: Payer: Self-pay | Admitting: Internal Medicine

## 2015-09-04 DIAGNOSIS — M546 Pain in thoracic spine: Secondary | ICD-10-CM | POA: Diagnosis not present

## 2015-09-04 DIAGNOSIS — M47814 Spondylosis without myelopathy or radiculopathy, thoracic region: Secondary | ICD-10-CM | POA: Diagnosis not present

## 2015-09-11 ENCOUNTER — Ambulatory Visit (INDEPENDENT_AMBULATORY_CARE_PROVIDER_SITE_OTHER): Payer: BLUE CROSS/BLUE SHIELD | Admitting: Internal Medicine

## 2015-09-11 ENCOUNTER — Encounter: Payer: Self-pay | Admitting: Internal Medicine

## 2015-09-11 VITALS — BP 128/84 | HR 80 | Ht 66.5 in | Wt 228.0 lb

## 2015-09-11 DIAGNOSIS — R221 Localized swelling, mass and lump, neck: Secondary | ICD-10-CM | POA: Diagnosis not present

## 2015-09-11 DIAGNOSIS — E041 Nontoxic single thyroid nodule: Secondary | ICD-10-CM | POA: Diagnosis not present

## 2015-09-11 LAB — T4, FREE: Free T4: 0.94 ng/dL (ref 0.60–1.60)

## 2015-09-11 LAB — TSH: TSH: 4.27 u[IU]/mL (ref 0.35–4.50)

## 2015-09-11 LAB — T3, FREE: T3, Free: 2.7 pg/mL (ref 2.3–4.2)

## 2015-09-11 NOTE — Progress Notes (Addendum)
Patient ID: Kerri Johnson, female   DOB: [birth year], 60 y.o.   MRN: [id number]   HPI  Kerri Johnson is a 60 y.o.-year-old female, referred by her PCP, Dr. Constance GoltzSchoenhoff, for evaluation and treatment for thyroid nodules and neck compression sxs. Moved from [place] 1.5 years ago.  She describes that she had R lobectomy in ~2003 in [place] for R thyroid nodules with neck compression sxs. She had to have RAI tx before the surgery (likely for hyperthyroidism) >> she did not respond to this >> surgery. The final pathology did not show malignancy.  She saw Dr Constance GoltzSchoenhoff earlier this year and she c/o dysphagia, choking at night (also stops breathing per husband's report). She wakes up dizzy and with HAs. She also has asthma. She did not have a sleep study yet.  Pt c/o: - + feeling nodules in L neck - no hoarseness - + dysphagia off and on - + SOB with lying down (has to hyperextend neck)  Thyroid U/S (06/06/2015):   Right thyroid lobe: 2.2 x 0.8 x 0.7 cm. Heterogeneous tissue.  Left thyroid lobe: 4.6 x 1.2 x 2.2 cm. 4 mm upper pole nodule is round and hypoechoic. Tiny mid lobe nodule.  Isthmus Thickness: 0.2 cm. No nodules visualized.  Lymphadenopathy: None visualized.  I reviewed pt's thyroid tests: Lab Results  Component Value Date   TSH 4.404 04/29/2014   TSH 3.647 11/23/2013   Pt otherwise denies: - fatigue - heat intolerance/cold intolerance - tremors - palpitations - anxiety/depression - hyperdefecation/constipation - weight loss/weight gain - dry skin - hair loss  No FH of thyroid ds. (unknown paternal side of FH). No FH of thyroid cancer. No h/o radiation tx to head or neck other than the RAI tx.   No seaweed or kelp. No recent contrast studies. + steroid inj in hip 2 days ago. No herbal supplements other than tea - ~ 1/2 gallon. No Biotin supplements or Hair, Skin and Nails vitamins.  ROS: Constitutional: no weight gain/loss, no fatigue, no subjective hyperthermia/hypothermia Eyes:  no blurry vision, no xerophthalmia ENT: no sore throat, + see HPI Cardiovascular: no CP/SOB/palpitations/leg swelling Respiratory: no cough/SOB Gastrointestinal: no N/V/D/C/+ acid reflux Musculoskeletal: no muscle/joint aches, exc. + back pain Skin: no rashes Neurological: no tremors/numbness/tingling/dizziness, + HA Psychiatric: no depression/anxiety  Past Medical History  Diagnosis Date  . Asthma   . GERD (gastroesophageal reflux disease)   . Colon polyps   . Hypertension   . Thyroid nodule     dx 2009   Past Surgical History  Procedure Laterality Date  . Cesarean section    . Abdominal hysterectomy    . Thyroidectomy, partial     Social History   Social History  . Marital Status: Married    Spouse Name: N/A  . Number of Children: 4   Occupational History  . N/a, was [employer]   Social History Main Topics  . Smoking status: Never Smoker   . Smokeless tobacco: Never Used  . Alcohol Use: No  . Drug Use: No   Current Outpatient Prescriptions on File Prior to Visit  Medication Sig Dispense Refill  . acetaminophen (TYLENOL) 325 MG tablet Take 650 mg by mouth every 6 (six) hours as needed for moderate pain or headache.     . albuterol (PROAIR HFA) 108 (90 BASE) MCG/ACT inhaler 2 puffs up to every 4 hours if can't catch your breath (Patient taking differently: Inhale 2 puffs into the lungs every 4 (four) hours as needed for wheezing or shortness  of breath. ) 1 Inhaler 11  . aspirin EC 81 MG tablet Take 81 mg by mouth every 4 (four) hours as needed for moderate pain.    . beclomethasone (QVAR) 80 MCG/ACT inhaler Take 2 puffs first thing in am and then another 2 puffs about 12 hours later. 1 Inhaler 11   No current facility-administered medications on file prior to visit.   Allergies  Allergen Reactions  . Iodine Anaphylaxis  . Shellfish Allergy Anaphylaxis  . Versed [Midazolam] Other (See Comments)    Effects memory   Family History  Problem Relation Age of Onset  .  Diabetes Mother   . Heart disease Mother   . Hypertension Mother   . Diabetes Sister   . Hypertension Sister   . Diabetes Sister    PE: BP 128/84 mmHg  Pulse 80  Ht 5' 6.5" (1.689 m)  Wt 228 lb (103.42 kg)  BMI 36.25 kg/m2  SpO2 95% Wt Readings from Last 3 Encounters:  09/11/15 228 lb (103.42 kg)  12/16/14 226 lb 6.4 oz (102.694 kg)  09/13/14 221 lb 11.2 oz (100.562 kg)   Constitutional: overweight, in NAD Eyes: PERRLA, EOMI, no exophthalmos ENT: moist mucous membranes, no thyromegaly but L thyroid fullness, no cervical lymphadenopathy Cardiovascular: RRR, No MRG Respiratory: CTA B Gastrointestinal: abdomen soft, NT, ND, BS+ Musculoskeletal: no deformities, strength intact in all 4;  Skin: moist, warm, no rashes Neurological: no tremor with outstretched hands, DTR normal in all 4  ASSESSMENT: 1. Thyroid nodules  2. Neck compression sxs  PLAN: 1. and 2. Multiple thyroid nodules and neck compression symptoms - I reviewed the images of her thyroid ultrasound along with the patient. I pointed out that there is residual tissue in the R thyroid lobe, which, in the absence of previous malignancy, is a likely remnant from the previous lobectomy. The left lobe is homogeneous with 2 tiny nodules, largest 3.8 mm in the largest dimensions. The L lobe is not enlarged, and the nodules are very small, and not appearing to compress her esophagus or trachea. In this case, I doubt her dysphagia and neck pressure sensation are caused by the thyroid. However, I suggested to check a Ba swallow (she had this performed before the initial surgery, so she is familiar with the test). However, I also suggested that she gets a sleep study as her nocturnal and am sxs are c/w OSA. She will d/w Dr Sherene SiresWert. - Pt does not have a thyroid cancer family history or a personal history of RxTx to head/neck, so no higher predisposition for thyroid cancer  - will check TFTs today along with TPO Abs. I explained that if  these are positive, she has a diagnosis of Hashimoto thyroiditis, which, especially at the beginning of its evolution can cause fluctuating inflammation of the thyroid, with intermittent neck compression. - I advised pt to join my chart and I will send her the results through there   Component     Latest Ref Rng 09/11/2015  TSH     0.35 - 4.50 uIU/mL 4.27  Triiodothyronine,Free,Serum     2.3 - 4.2 pg/mL 2.7  T4,Free(Direct)     0.60 - 1.60 ng/dL [id number]  Thyroperoxidase Ab SerPl-aCnc     <9 IU/mL 1  Normal TFTs.  DG Esophagus  Order: [id number]  Status:  Final result Visible to patient:  No (Not Released) Dx:  Thyroid nodules  Details   Reading Physician Reading Date Result Priority  Gordan PaymentSteve Reid, MD 10/13/2015   Narrative  CLINICAL DATA: Feeling of a lump in the throat after a swallowing for several weeks. The patient has intermittent reflux symptoms and takes Prilosec as needed. Previous partial thyroidectomy.  EXAM: ESOPHOGRAM/BARIUM SWALLOW  TECHNIQUE: Combined double contrast and single contrast examination performed using effervescent crystals, thick barium liquid, and thin barium liquid.  FLUOROSCOPY TIME: Radiation Exposure Index (as provided by the fluoroscopic device): 57 d Gy cm 2  If the device does not provide the exposure index:  Fluoroscopy Time: 1 minutes 42 seconds  Number of Acquired Images: 12  COMPARISON: Chest radiographs dated 09/16/2015.  FINDINGS: The patient swallowed barium without difficulty. Normal esophageal peristalsis with no hypopharyngeal abnormalities. The esophagus has a normal appearance. No strictures, masses or ulcerations were seen. No hiatal hernia or gastroesophageal reflux were seen during the examination. The patient swallowed a 12.5 mm in diameter barium tablet without difficulty. This passed normally through the esophagus and into the stomach.  IMPRESSION: Normal examination.   Electronically Signed By: Beckie Salts M.D. On: 10/13/2015 10:44       Normal barium swallow >>  there is no evidence of thyroid compression on the esophagus.  Carlus Pavlov, MD PhD [facility]

## 2015-09-11 NOTE — Patient Instructions (Signed)
Please stop at the lab.  We will schedule a barium swallowing study.  Please return in 6 months with your sugar log.

## 2015-09-12 LAB — THYROID PEROXIDASE ANTIBODY: Thyroperoxidase Ab SerPl-aCnc: 1 IU/mL (ref <9)

## 2015-09-15 ENCOUNTER — Telehealth: Payer: Self-pay

## 2015-09-15 DIAGNOSIS — R221 Localized swelling, mass and lump, neck: Secondary | ICD-10-CM | POA: Insufficient documentation

## 2015-09-15 DIAGNOSIS — E041 Nontoxic single thyroid nodule: Secondary | ICD-10-CM | POA: Insufficient documentation

## 2015-09-15 NOTE — Telephone Encounter (Signed)
Called patient to notify of normal results, no answer and no voicemail to leave message, will try again later.

## 2015-09-16 ENCOUNTER — Other Ambulatory Visit: Payer: Self-pay | Admitting: Internal Medicine

## 2015-09-16 ENCOUNTER — Ambulatory Visit
Admission: RE | Admit: 2015-09-16 | Discharge: 2015-09-16 | Disposition: A | Discharge status: Home / Self Care | Payer: BLUE CROSS/BLUE SHIELD | Source: Ambulatory Visit | Attending: Internal Medicine | Admitting: Internal Medicine

## 2015-09-16 DIAGNOSIS — R9389 Abnormal findings on diagnostic imaging of other specified body structures: Secondary | ICD-10-CM

## 2015-09-16 DIAGNOSIS — E041 Nontoxic single thyroid nodule: Secondary | ICD-10-CM | POA: Diagnosis not present

## 2015-09-16 DIAGNOSIS — R938 Abnormal findings on diagnostic imaging of other specified body structures: Secondary | ICD-10-CM | POA: Diagnosis not present

## 2015-09-16 DIAGNOSIS — R918 Other nonspecific abnormal finding of lung field: Secondary | ICD-10-CM | POA: Diagnosis not present

## 2015-09-16 DIAGNOSIS — M546 Pain in thoracic spine: Secondary | ICD-10-CM | POA: Diagnosis not present

## 2015-10-09 ENCOUNTER — Telehealth: Payer: Self-pay | Admitting: Internal Medicine

## 2015-10-09 NOTE — Telephone Encounter (Signed)
Noted where patient called and was scheduled for her barium swallow study, per MD note she was suppose to have this done.

## 2015-10-09 NOTE — Telephone Encounter (Signed)
Patient stated that she got a call to set up an appointment to have barium swallow.please advisej

## 2015-10-13 ENCOUNTER — Ambulatory Visit
Admission: RE | Admit: 2015-10-13 | Discharge: 2015-10-13 | Disposition: A | Discharge status: Home / Self Care | Payer: BLUE CROSS/BLUE SHIELD | Source: Ambulatory Visit | Attending: Internal Medicine | Admitting: Internal Medicine

## 2015-10-13 DIAGNOSIS — E041 Nontoxic single thyroid nodule: Secondary | ICD-10-CM

## 2015-10-13 DIAGNOSIS — K219 Gastro-esophageal reflux disease without esophagitis: Secondary | ICD-10-CM | POA: Diagnosis not present

## 2015-10-14 ENCOUNTER — Telehealth: Payer: Self-pay

## 2015-10-14 NOTE — Telephone Encounter (Signed)
Spoke with patient about results from barium swallow. Patient had no questions or concerns at this time.

## 2015-11-25 ENCOUNTER — Encounter: Payer: Self-pay | Admitting: Internal Medicine

## 2015-11-25 ENCOUNTER — Ambulatory Visit (INDEPENDENT_AMBULATORY_CARE_PROVIDER_SITE_OTHER): Payer: BLUE CROSS/BLUE SHIELD | Admitting: Internal Medicine

## 2015-11-25 VITALS — BP 130/88 | HR 85 | Ht 67.0 in | Wt 227.4 lb

## 2015-11-25 DIAGNOSIS — R0683 Snoring: Secondary | ICD-10-CM | POA: Diagnosis not present

## 2015-11-25 DIAGNOSIS — N6042 Mammary duct ectasia of left breast: Secondary | ICD-10-CM | POA: Diagnosis not present

## 2015-11-25 DIAGNOSIS — R938 Abnormal findings on diagnostic imaging of other specified body structures: Secondary | ICD-10-CM | POA: Diagnosis not present

## 2015-11-25 DIAGNOSIS — J4540 Moderate persistent asthma, uncomplicated: Secondary | ICD-10-CM

## 2015-11-25 DIAGNOSIS — E041 Nontoxic single thyroid nodule: Secondary | ICD-10-CM | POA: Diagnosis not present

## 2015-11-25 DIAGNOSIS — Z23 Encounter for immunization: Secondary | ICD-10-CM | POA: Diagnosis not present

## 2015-11-25 NOTE — Progress Notes (Signed)
Subjective:     Patient ID: Marlyne Beardsnna Horney, female   DOB: November 14, 1955,   MRN: [id number]     Brief patient profile:  [id number] yobf never smoker [place] with problems with asthma as long back as she can remember initially just prn rx for  intermittent problems then changed over maint rx since 1990s most recently under care of [facility] doctor = Awanda MinkJohn Bolt in [place] on spiriva/ respimat daily (apparently failed advair) and [place] referred to pulmonary clinic 05/10/14     05/10/2014 1st [facility] office visit/ Dom Haverland   Chief Complaint  Patient presents with  . Advice Only    ref by Dr. Hamilton Caprihao Le; asthma/COPD; needs to est. care since moving here. no SOB/cough  bleach and cold weather cause flares, she's  not typically limited by breathing with 2 flares of asthma in last 6 months requiring proaire up to 3 x daily but not typically not needing any prednisone and no recent flares, happy with spiriva and no recent need for proair rec If not doing well with your reflux symptoms or breathing change prilosec to Take 30- 60 min before your first and last meals of the day  GERD  Diet  Please schedule a follow up visit in 3 months but call sooner if needed.     06/26/2014 f/u ov/Havard Radigan re: copd vs chronic  asthma/ maint rx with spiriva x 3 years per Dr Donnalee CurryBoldt (pulmonary)  Chief Complaint  Patient presents with  . Acute Visit    Pt c/o increased SOB for the past 5 days- started after they installed a new bathroom in her home. She is also wheezing and having chest tightness. She is using rescue inhaler 4-6 x per day.   using proaire respiclick/ severe hoarseness / did not activate "action plan"  rec Plan A = Automatic  = symbicort 80 Take 2 puffs first thing in am and then another 2 puffs about 12 hours later and continue spiriva for now as well               Pantoprazole (protonix) 40 mg   Take 30-60 min before first meal of the day  and prilosec 20 mg 30 min before supper Plan B  Only use your albutero(proair)  As rescue  Plan C = contingency> Prednisone 10 mg take  4 each am x 2 days,   2 each am x 2 days,  1 each am x 2 days and stop  GERD diet      08/16/2014 f/u ov/Terrea Bruster re: chronic asthma / undertreated inflammatory component on spiriva 1.25 mg only  Chief Complaint  Patient presents with  . Follow-up    Pt c/o wheezing, sore throat, dry cough. Slight chest congestion and PND. Pt states she no longer takes symbicort due to chest pain and rapid heart rate.   could not tolerate symbicort more than a week  Thinks staying out of bad part of house is what really helps her breathing / rarely feeling need for saba/ then  worse x 3 days with sore throat but no fever, denies missing doses of ppi/h2hs  rec Plan A = Automatic  = Qvar  80 Take 2 puffs first thing in am and then another 2 puffs about 12 hours later and continue spiriva for now as well               Pantoprazole (protonix) 40 mg  Take 30-60 min before first meal of the day and prilosec 20 mg 30 min before supper for now Plan B  Only use your albutero(proair)  as a rescue medication       09/13/2014 f/u ov/Everlyn Farabaugh re: asthma  Chief Complaint  Patient presents with  . Follow-up    Pt states her breathing has improved back to baseline. Her cough and wheezing has resolved and she has not used rescue inhaler.    mild sob with heavy ex only having tapered spiriva to one puff q am and still on qvar 2 bid s need for rescue though hfa poor (see a/p)  Not using ppi ac as rec  rec Stop spiriva to see what difference if any it makes (like high octane fuel, it takes a while to appreciate benefit or lack thereof) - if worse resume 2 pffs each am  Work on maintaining perfect  inhaler technique:  relax and gently blow all the way out then take a nice smooth deep breath back in, triggering the inhaler at same time you start breathing in. Hold for up to 5 seconds if you can.    Blow out thru nose.  Rinse and gargle with water when done Only use your albuterol as a rescue medication Be sure you take the prilosec 20 mg 30 min before supper     11/25/2015  f/u ov/Deneene Tarver re: asthma on qvar 80 2 q pm / primary is shoenoff  Chief Complaint  Patient presents with  . Follow-up    Breathing is doing well. She rarely uses albuterol. No new co's today/  Not limited by breathing from desired activities     No obvious day to day or daytime variability or assoc excess/ purulent sputum or mucus plugs or hemoptysis or cp or chest tightness, subjective wheeze or overt sinus or hb symptoms. No unusual exp hx or h/o childhood pna/ asthma or knowledge of premature birth.  Sleeping ok without nocturnal  or early am exacerbation  of respiratory  c/o's or need for noct saba. Also denies any obvious fluctuation of symptoms with weather or environmental changes or other aggravating or alleviating factors except as outlined above   Current Medications, Allergies, Complete Past Medical History, Past Surgical History, Family History, and Social History were reviewed in [employer] record.  ROS  The following are not active complaints unless bolded sore throat, dysphagia, dental problems, itching, sneezing,  nasal congestion or excess/ purulent secretions, ear ache,   fever, chills, sweats, unintended wt loss, classically pleuritic or exertional cp,  orthopnea pnd or leg swelling, presyncope, palpitations, abdominal pain, anorexia, nausea, vomiting, diarrhea  or change in bowel or bladder habits, change in stools or urine, dysuria,hematuria,  rash, arthralgias, visual complaints, headache, numbness, weakness or ataxia or problems with walking or coordination,  change in mood/affect or memory.              Objective:   Physical Exam  amb obese  bf nad   06/26/2014        222 >  09/13/2014 222  > 11/25/2015  227   :  05/10/14 232 lb (105.235 kg)  04/29/14 227 lb  (102.967 kg)  12/20/13 218 lb (98.884 kg)    Vital signs reviewed  - sats 97% on RA on arrival   HEENT: nl dentition, turbinates, and orophanx. Nl external ear canals without cough reflex   NECK :  without JVD/Nodes/TM/ nl carotid upstrokes bilaterally   LUNGS: no acc  muscle use, clear to A and P bilaterally without cough on insp or exp maneuvers   CV:  RRR  no s3 or murmur or increase in P2, no edema   ABD:  soft and nontender with nl excursion in the supine position. No bruits or organomegaly, bowel sounds nl  MS:  warm without deformities, calf tenderness, cyanosis or clubbing  SKIN: warm and dry without lesions    NEURO:  alert, approp, no deficits   CXR PA and Lateral:   05/10/2014 :     I personally reviewed images and agree with radiology impression as follows:      Hyperinflation consistent with known reactive airway disease. There is no pneumonia nor other acute cardiopulmonary abnormality.      Assessment:

## 2015-11-25 NOTE — Patient Instructions (Addendum)
Plan A = Automatic =  Qvar 80 Take 2 puffs first thing in am and then another 2 puffs about 12 hours later.   Plan B = Backup Only use your albuterol as a rescue medication to be used if you can't catch your breath by resting or doing a relaxed purse lip breathing pattern.  - The less you use it, the better it will work when you need it. - Ok to use the inhaler up to 2 puffs  every 4 hours if you must but call for appointment if use goes up over your usual need - Don't leave home without it !!  (think of it like the spare tire for your car)   Plan C = Crisis - only use your albuterol nebulizer if you first try Plan B and it fails to help > ok to use the nebulizer up to every 4 hours but if start needing it regularly call for immediate appointment   Plan D = Doctor - call me if B and C not adequate  Plan E = ER - go to ER or call 911 if all else fails     Please schedule a follow up visit in 12 months but call sooner if needed

## 2015-11-30 ENCOUNTER — Encounter: Payer: Self-pay | Admitting: Internal Medicine

## 2015-11-30 DIAGNOSIS — E669 Obesity, unspecified: Secondary | ICD-10-CM | POA: Insufficient documentation

## 2015-11-30 NOTE — Assessment & Plan Note (Signed)
-   started on symbicort 80 2bid 06/26/14 > stopped  07/07/14   - PFTs  06/28/14 done during acute flare  FEV1  1.80 (73%) ratio 54 p 38% from bronchodilators and  Near nl dlco - 08/16/2014   try qvar 80 2bid as could not tolerate laba effecs - 09/13/2014 p extensive coaching HFA effectiveness =    90%  - spirometry 09/13/2014   FEV1  1.72 (72%) ratio 63 s any bronchodilator  - Spirometry 11/25/2015  FEV1 1.49 (62%)  Ratio 58  s any bronchodilators    She is consistently inconsistent with maint rx.   I used  the analogy of putting steroid cream on a rash to help explain the meaning of topical therapy and the need to get the drug to the target tissue.    I had an extended discussion with the patient reviewing all relevant studies completed to date and  lasting 15 to 20 minutes of a 25 minute visit    Each maintenance medication was reviewed in detail including most importantly the difference between maintenance and prns and under what circumstances the prns are to be triggered using an action plan format that is not reflected in the computer generated alphabetically organized AVS.    Please see instructions for details which were reviewed in writing and the patient given a copy highlighting the part that I personally wrote and discussed at today's ov.

## 2015-11-30 NOTE — Assessment & Plan Note (Signed)
Body mass index is 35.62  Lab Results  Component Value Date   TSH 4.27 09/11/2015     Contributing to gerd t risk, potential for doe/reviewed the need and the process to achieve and maintain neg calorie balance > defer f/u primary care including intermittently monitoring thyroid status

## 2015-12-05 ENCOUNTER — Other Ambulatory Visit: Payer: Self-pay | Admitting: Internal Medicine

## 2016-01-28 ENCOUNTER — Other Ambulatory Visit: Payer: Self-pay | Admitting: Surgery

## 2016-01-28 DIAGNOSIS — N6042 Mammary duct ectasia of left breast: Secondary | ICD-10-CM

## 2016-02-01 ENCOUNTER — Other Ambulatory Visit: Payer: Self-pay | Admitting: Internal Medicine

## 2016-02-03 ENCOUNTER — Other Ambulatory Visit: Payer: Self-pay | Admitting: Surgery

## 2016-02-03 DIAGNOSIS — N63 Unspecified lump in unspecified breast: Secondary | ICD-10-CM

## 2016-02-13 ENCOUNTER — Other Ambulatory Visit: Payer: BLUE CROSS/BLUE SHIELD

## 2016-02-16 DIAGNOSIS — R0681 Apnea, not elsewhere classified: Secondary | ICD-10-CM | POA: Diagnosis not present

## 2016-02-24 ENCOUNTER — Ambulatory Visit
Admission: RE | Admit: 2016-02-24 | Discharge: 2016-02-24 | Disposition: A | Discharge status: Home / Self Care | Payer: BLUE CROSS/BLUE SHIELD | Source: Ambulatory Visit | Attending: Surgery | Admitting: Surgery

## 2016-02-24 DIAGNOSIS — N63 Unspecified lump in unspecified breast: Secondary | ICD-10-CM

## 2016-02-24 DIAGNOSIS — R928 Other abnormal and inconclusive findings on diagnostic imaging of breast: Secondary | ICD-10-CM | POA: Diagnosis not present

## 2016-02-24 DIAGNOSIS — N6042 Mammary duct ectasia of left breast: Secondary | ICD-10-CM

## 2016-02-26 DIAGNOSIS — G4733 Obstructive sleep apnea (adult) (pediatric): Secondary | ICD-10-CM | POA: Diagnosis not present

## 2016-03-12 ENCOUNTER — Ambulatory Visit (INDEPENDENT_AMBULATORY_CARE_PROVIDER_SITE_OTHER): Payer: BLUE CROSS/BLUE SHIELD | Admitting: Internal Medicine

## 2016-03-12 DIAGNOSIS — Z0289 Encounter for other administrative examinations: Secondary | ICD-10-CM

## 2016-03-30 DIAGNOSIS — B349 Viral infection, unspecified: Secondary | ICD-10-CM | POA: Diagnosis not present

## 2016-03-30 DIAGNOSIS — J45909 Unspecified asthma, uncomplicated: Secondary | ICD-10-CM | POA: Diagnosis not present

## 2016-03-30 DIAGNOSIS — R509 Fever, unspecified: Secondary | ICD-10-CM | POA: Diagnosis not present

## 2016-04-21 ENCOUNTER — Other Ambulatory Visit: Payer: Self-pay | Admitting: Internal Medicine

## 2016-04-21 ENCOUNTER — Ambulatory Visit
Admission: RE | Admit: 2016-04-21 | Discharge: 2016-04-21 | Disposition: A | Discharge status: Home / Self Care | Payer: BLUE CROSS/BLUE SHIELD | Source: Ambulatory Visit | Attending: Internal Medicine | Admitting: Internal Medicine

## 2016-04-21 DIAGNOSIS — M25552 Pain in left hip: Secondary | ICD-10-CM

## 2016-04-21 DIAGNOSIS — M545 Low back pain: Secondary | ICD-10-CM | POA: Diagnosis not present

## 2016-04-21 DIAGNOSIS — T300 Burn of unspecified body region, unspecified degree: Secondary | ICD-10-CM | POA: Diagnosis not present

## 2016-04-21 DIAGNOSIS — S3992XA Unspecified injury of lower back, initial encounter: Secondary | ICD-10-CM | POA: Diagnosis not present

## 2016-04-28 DIAGNOSIS — M47816 Spondylosis without myelopathy or radiculopathy, lumbar region: Secondary | ICD-10-CM | POA: Diagnosis not present

## 2016-05-10 DIAGNOSIS — M62830 Muscle spasm of back: Secondary | ICD-10-CM | POA: Diagnosis not present

## 2016-05-27 DIAGNOSIS — Z0000 Encounter for general adult medical examination without abnormal findings: Secondary | ICD-10-CM | POA: Diagnosis not present

## 2016-09-22 ENCOUNTER — Other Ambulatory Visit: Payer: Self-pay | Admitting: Surgery

## 2016-09-22 DIAGNOSIS — R921 Mammographic calcification found on diagnostic imaging of breast: Secondary | ICD-10-CM

## 2016-10-11 ENCOUNTER — Other Ambulatory Visit: Payer: BLUE CROSS/BLUE SHIELD

## 2016-10-12 ENCOUNTER — Other Ambulatory Visit: Payer: Self-pay | Admitting: Surgery

## 2016-10-12 ENCOUNTER — Ambulatory Visit: Payer: BLUE CROSS/BLUE SHIELD

## 2016-10-12 ENCOUNTER — Ambulatory Visit
Admission: RE | Admit: 2016-10-12 | Discharge: 2016-10-12 | Disposition: A | Discharge status: Home / Self Care | Payer: BLUE CROSS/BLUE SHIELD | Source: Ambulatory Visit | Attending: Surgery | Admitting: Surgery

## 2016-10-12 DIAGNOSIS — R922 Inconclusive mammogram: Secondary | ICD-10-CM | POA: Diagnosis not present

## 2016-10-12 DIAGNOSIS — N6320 Unspecified lump in the left breast, unspecified quadrant: Secondary | ICD-10-CM

## 2016-10-12 DIAGNOSIS — R921 Mammographic calcification found on diagnostic imaging of breast: Secondary | ICD-10-CM

## 2016-10-15 ENCOUNTER — Telehealth: Payer: Self-pay | Admitting: Internal Medicine

## 2016-10-15 MED ORDER — BECLOMETHASONE DIPROP HFA 80 MCG/ACT IN AERB: 2.0000 | INHALATION_SPRAY | Freq: Two times a day (BID) | RESPIRATORY_TRACT | 2 refills | Status: DC

## 2016-10-15 NOTE — Telephone Encounter (Signed)
Okay to give redihaler Rx and go over demonstration over the phone

## 2016-10-15 NOTE — Telephone Encounter (Signed)
[facility] She [facility] [address]  She has already spoken with the pt and she is aware and will go there to pick up  I gave her a VO  Pt aware and will have pharm tech show her proper use Nothing further needed

## 2016-10-15 NOTE — Telephone Encounter (Signed)
Spoke with the pt  She states that the qvar 80 is on back order and she is needing replacement  She is completely out of med  MW does not like to give the redihaler w/o demonstration here in the office  Please advise on alternative, thanks!

## 2016-10-25 DIAGNOSIS — N6042 Mammary duct ectasia of left breast: Secondary | ICD-10-CM | POA: Diagnosis not present

## 2016-10-29 ENCOUNTER — Encounter (HOSPITAL_COMMUNITY): Payer: Self-pay | Admitting: Emergency Medicine

## 2016-10-29 ENCOUNTER — Emergency Department (HOSPITAL_COMMUNITY): Payer: BLUE CROSS/BLUE SHIELD

## 2016-10-29 ENCOUNTER — Emergency Department (HOSPITAL_COMMUNITY)
Admission: EM | Admit: 2016-10-29 | Discharge: 2016-10-29 | Disposition: A | Discharge status: Home / Self Care | Payer: BLUE CROSS/BLUE SHIELD | Attending: Emergency Medicine | Admitting: Emergency Medicine

## 2016-10-29 DIAGNOSIS — J4540 Moderate persistent asthma, uncomplicated: Secondary | ICD-10-CM | POA: Diagnosis not present

## 2016-10-29 DIAGNOSIS — I10 Essential (primary) hypertension: Secondary | ICD-10-CM | POA: Insufficient documentation

## 2016-10-29 DIAGNOSIS — R52 Pain, unspecified: Secondary | ICD-10-CM

## 2016-10-29 DIAGNOSIS — Y9389 Activity, other specified: Secondary | ICD-10-CM | POA: Diagnosis not present

## 2016-10-29 DIAGNOSIS — S299XXA Unspecified injury of thorax, initial encounter: Secondary | ICD-10-CM | POA: Diagnosis not present

## 2016-10-29 DIAGNOSIS — Z79899 Other long term (current) drug therapy: Secondary | ICD-10-CM | POA: Insufficient documentation

## 2016-10-29 DIAGNOSIS — Y92410 Unspecified street and highway as the place of occurrence of the external cause: Secondary | ICD-10-CM | POA: Diagnosis not present

## 2016-10-29 DIAGNOSIS — M546 Pain in thoracic spine: Secondary | ICD-10-CM | POA: Insufficient documentation

## 2016-10-29 DIAGNOSIS — M549 Dorsalgia, unspecified: Secondary | ICD-10-CM | POA: Diagnosis present

## 2016-10-29 DIAGNOSIS — M545 Low back pain: Secondary | ICD-10-CM | POA: Diagnosis not present

## 2016-10-29 DIAGNOSIS — Y999 Unspecified external cause status: Secondary | ICD-10-CM | POA: Diagnosis not present

## 2016-10-29 MED ORDER — CYCLOBENZAPRINE HCL 10 MG PO TABS: 10.0000 mg | ORAL_TABLET | Freq: Every evening | ORAL | 0 refills | Status: DC | PRN

## 2016-10-29 MED ORDER — NAPROXEN 500 MG PO TABS
500.0000 mg | ORAL_TABLET | Freq: Two times a day (BID) | ORAL | Status: DC
  Administered 2016-10-29: 500 mg via ORAL
  Filled 2016-10-29: qty 1

## 2016-10-29 MED ORDER — HYDROCODONE-ACETAMINOPHEN 5-325 MG PO TABS: 1.0000 | ORAL_TABLET | Freq: Four times a day (QID) | ORAL | 0 refills | Status: DC | PRN

## 2016-10-29 NOTE — Discharge Instructions (Signed)
Please read instructions below.  Talk with your PCP about any new medications.  Drink plenty of water. You can take Norco every 6 hours as needed for severe pain. Do not take Tylenol, drink alcohol, or drive while taking this medication. You can take Advil every 6 hours as needed for mild to moderate pain. You can take Flexeril at bedtime as needed for muscle spasm. Apply ice to your back for 20 minutes at a time. You can also apply heat if that provides more relief. Return to ER if new numbness or tingling in your arms or legs, inability to urinate, inability to hold your bowels, or weakness in your extremities.
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
## 2016-11-09 DIAGNOSIS — M5137 Other intervertebral disc degeneration, lumbosacral region: Secondary | ICD-10-CM | POA: Diagnosis not present

## 2016-11-09 DIAGNOSIS — M546 Pain in thoracic spine: Secondary | ICD-10-CM | POA: Diagnosis not present

## 2016-11-24 ENCOUNTER — Ambulatory Visit: Payer: BLUE CROSS/BLUE SHIELD | Admitting: Internal Medicine

## 2016-11-29 ENCOUNTER — Encounter: Payer: Self-pay | Admitting: Internal Medicine

## 2016-11-29 ENCOUNTER — Ambulatory Visit (INDEPENDENT_AMBULATORY_CARE_PROVIDER_SITE_OTHER): Payer: BLUE CROSS/BLUE SHIELD | Admitting: Internal Medicine

## 2016-11-29 VITALS — BP 126/74 | HR 74 | Ht 67.0 in | Wt 216.0 lb

## 2016-11-29 DIAGNOSIS — J4540 Moderate persistent asthma, uncomplicated: Secondary | ICD-10-CM | POA: Diagnosis not present

## 2016-11-29 DIAGNOSIS — Z23 Encounter for immunization: Secondary | ICD-10-CM

## 2016-11-29 DIAGNOSIS — E669 Obesity, unspecified: Secondary | ICD-10-CM | POA: Diagnosis not present

## 2016-11-29 NOTE — Assessment & Plan Note (Signed)
Body mass index is 33.83 kg/m.  -  trending down/ encouraged  Lab Results  Component Value Date   TSH 4.27 09/11/2015     Contributing to gerd risk/ doe/reviewed the need and the process to achieve and maintain neg calorie balance > defer f/u primary care including intermittently monitoring thyroid status

## 2016-11-29 NOTE — Progress Notes (Signed)
Subjective:    Patient ID: Kerri Johnson, female   DOB: [birth year]    MRN: [id number]     Brief patient profile:  57 yobf never smoker grew up in  [place] with problems with asthma as long back as she can remember initially just prn rx for  intermittent problems then changed over maint rx since 1990s most recently under care of [facility] doctor = Awanda Mink in [place] on spiriva/ respimat daily (apparently failed advair) and moved to [facility] summer 2015 referred to pulmonary clinic 05/10/14    History of Present Illness  05/10/2014 1st  Pulmonary office visit/ [facility]   Chief Complaint  Patient presents with  . Advice Only    ref by Dr. Hamilton Capri; asthma/COPD; needs to est. care since moving here. no SOB/cough  bleach and cold weather cause flares, she's  not typically limited by breathing with 2 flares of asthma in last 6 months requiring proaire up to 3 x daily but not typically not needing any prednisone and no recent flares, happy with spiriva and no recent need for proair rec If not doing well with your reflux symptoms or breathing change prilosec to Take 30- 60 min before your first and last meals of the day  GERD  Diet  Please schedule a follow up visit in 3 months but call sooner if needed.     06/26/2014 f/u ov/[facility] re: copd vs chronic  asthma/ maint rx with spiriva x 3 years per Dr Donnalee Curry (pulmonary)  Chief Complaint  Patient presents with  . Acute Visit    Pt c/o increased SOB for the past 5 days- started after they installed a new bathroom in her home. She is also wheezing and having chest tightness. She is using rescue inhaler 4-6 x per day.   using proaire respiclick/ severe hoarseness / did not activate "action plan"  rec Plan A = Automatic  = symbicort 80 Take 2 puffs first thing in am and then another 2 puffs about 12 hours later and continue spiriva for now as well               Pantoprazole (protonix) 40 mg   Take 30-60 min  before first meal of the day and prilosec 20 mg 30 min before supper Plan B  Only use your albutero(proair)  As rescue  Plan C = contingency> Prednisone 10 mg take  4 each am x 2 days,   2 each am x 2 days,  1 each am x 2 days and stop  GERD diet      08/16/2014 f/u ov/[facility] re: chronic asthma / undertreated inflammatory component on spiriva 1.25 mg only  Chief Complaint  Patient presents with  . Follow-up    Pt c/o wheezing, sore throat, dry cough. Slight chest congestion and PND. Pt states she no longer takes symbicort due to chest pain and rapid heart rate.   could not tolerate symbicort more than a week  Thinks staying out of bad part of house is what really helps her breathing / rarely feeling need for saba/ then  worse x 3 days with sore throat but no fever, denies missing doses of ppi/h2hs  rec Plan A = Automatic  = Qvar  80 Take 2 puffs first thing in am and then another 2 puffs about 12 hours later and continue spiriva for now as well               Pantoprazole (protonix)  40 mg   Take 30-60 min before first meal of the day and prilosec 20 mg 30 min before supper for now Plan B  Only use your albutero(proair)  as a rescue medication           11/25/2015  f/u ov/[facility] re: asthma on qvar 80 2 q pm / primary is shoenoff  Chief Complaint  Patient presents with  . Follow-up    Breathing is doing well. She rarely uses albuterol. No new co's today/  Not limited by breathing from desired activities   rec Plan A = Automatic =  Qvar 80 Take 2 puffs first thing in am and then another 2 puffs about 12 hours later.  Plan B = Backup Only use your albuterol as a rescue medication Plan C = Crisis - only use your albuterol nebulizer if you first try Plan B       11/29/2016  f/u ov/[facility]ert re:  Chronic asthma on qvar 80 2 each pm only  Chief Complaint  Patient presents with  . Follow-up    Pt states that her breathing is doing well. No new co's. She rarely uses her albuterol.      sleeps fine/ ex on treadmill x 45 min 2x weekly x 3lpm variable incline s sob   Last flare cough/sob/wheeze x one month prior to OV   p bleach exp / rx x one saba and did fine s escalation of qvar rx No noct flares  No obvious day to day or daytime variability or assoc excess/ purulent sputum or mucus plugs or hemoptysis or cp or chest tightness, subjective wheeze or overt sinus or hb symptoms. No unusual exp hx or h/o childhood pna/ asthma or knowledge of premature birth.  Sleeping ok flat without nocturnal  or early am exacerbation  of respiratory  c/o's or need for noct saba. Also denies any obvious fluctuation of symptoms with weather or environmental changes or other aggravating or alleviating factors except as outlined above   Current Allergies, Complete Past Medical History, Past Surgical History, Family History, and Social History were reviewed in [employer] record.  ROS  The following are not active complaints unless bolded sore throat, dysphagia, dental problems, itching, sneezing,  nasal congestion or disharge of excess mucus or purulent secretions, ear ache,   fever, chills, sweats, unintended wt loss or wt gain, classically pleuritic or exertional cp,  orthopnea pnd or leg swelling, presyncope, palpitations, abdominal pain, anorexia, nausea, vomiting, diarrhea  or change in bowel habits or bladder habits, change in stools or change in urine, dysuria, hematuria,  rash, arthralgias, visual complaints, headache, numbness, weakness or ataxia or problems with walking or coordination,  change in mood/affect or memory.        Current Meds  Medication Sig  . acetaminophen (TYLENOL) 325 MG tablet Take 650 mg by mouth every 6 (six) hours as needed for moderate pain or headache.   . albuterol (VENTOLIN HFA) 108 (90 Base) MCG/ACT inhaler Inhale 2 puffs into the lungs every 6 (six) hours as needed for wheezing or shortness of breath.  . beclomethasone (QVAR REDIHALER) 80  MCG/ACT inhaler Inhale 2 puffs into the lungs 2 (two) times daily.  . Cholecalciferol (VITAMIN D3 PO) Take 1 capsule by mouth daily.  . cyclobenzaprine (FLEXERIL) 10 MG tablet Take 1 tablet (10 mg total) by mouth at bedtime as needed for muscle spasms.  Objective:   Physical Exam  amb obese  bf nad / all smiles   06/26/2014        222 >  09/13/2014 222  > 11/25/2015  227 > 11/29/2016  216     05/10/14 232 lb (105.235 kg)  04/29/14 227 lb (102.967 kg)  12/20/13 218 lb (98.884 kg)    Vital signs reviewed  -   - Note on arrival 02 sats  97% on RA    HEENT: nl dentition, turbinates, and orophanx. Nl external ear canals without cough reflex   NECK :  without JVD/Nodes/TM/ nl carotid upstrokes bilaterally   LUNGS: no acc muscle use, clear to A and P bilaterally without cough on insp or exp maneuvers   CV:  RRR  no s3 or murmur or increase in P2, no edema   ABD:  soft and nontender with nl excursion in the supine position. No bruits or organomegaly, bowel sounds nl  MS:  warm without deformities, calf tenderness, cyanosis or clubbing  SKIN: warm and dry without lesions    NEURO:  alert, approp, no deficits         Assessment:

## 2016-11-29 NOTE — Assessment & Plan Note (Signed)
-  started on symbicort 80 2bid 06/26/14 > stopped  07/07/14   - PFTs  06/28/14 done during acute flare  FEV1  1.80 (73%) ratio 54 p 38% from bronchodilators and  Near nl dlco - 08/16/2014   try qvar 80 2bid as could not tolerate laba effects - 09/13/2014 p extensive coaching HFA effectiveness =    90%  - spirometry 09/13/2014   FEV1  1.72 (72%) ratio 63 s any bronchodilator  - Spirometry 11/25/2015  FEV1 1.49 (62%)  Ratio 58  s any bronchodilators   She has done exceptionally well clinically x one year so All goals of chronic asthma control met including optimal (though not necessarily nl) function and elimination of symptoms with minimal need for rescue therapy.  Contingencies discussed in full including contacting this office immediately if not controlling the symptoms using the rule of two's.     Each maintenance medication was reviewed in detail including most importantly the difference between maintenance and as needed and under what circumstances the prns are to be used.  Please see AVS for specific  Instructions which are unique to this visit and I personally typed out  which were reviewed in detail in writing with the patient and a copy provided.

## 2016-11-29 NOTE — Patient Instructions (Signed)
No change in medications   Please schedule a follow up visit in 12  months but call sooner if needed  

## 2016-12-07 ENCOUNTER — Other Ambulatory Visit: Payer: Self-pay | Admitting: Internal Medicine

## 2016-12-07 DIAGNOSIS — R42 Dizziness and giddiness: Secondary | ICD-10-CM | POA: Diagnosis not present

## 2016-12-07 DIAGNOSIS — M545 Low back pain: Secondary | ICD-10-CM | POA: Diagnosis not present

## 2016-12-10 ENCOUNTER — Ambulatory Visit
Admission: RE | Admit: 2016-12-10 | Discharge: 2016-12-10 | Disposition: A | Discharge status: Home / Self Care | Payer: BLUE CROSS/BLUE SHIELD | Source: Ambulatory Visit | Attending: Internal Medicine | Admitting: Internal Medicine

## 2016-12-10 DIAGNOSIS — R42 Dizziness and giddiness: Secondary | ICD-10-CM

## 2016-12-10 DIAGNOSIS — R51 Headache: Secondary | ICD-10-CM | POA: Diagnosis not present

## 2016-12-14 DIAGNOSIS — R42 Dizziness and giddiness: Secondary | ICD-10-CM | POA: Diagnosis not present

## 2016-12-14 DIAGNOSIS — R9431 Abnormal electrocardiogram [ECG] [EKG]: Secondary | ICD-10-CM | POA: Diagnosis not present

## 2016-12-14 DIAGNOSIS — R0789 Other chest pain: Secondary | ICD-10-CM | POA: Diagnosis not present

## 2016-12-17 ENCOUNTER — Encounter: Payer: Self-pay | Admitting: *Deleted

## 2017-01-03 ENCOUNTER — Ambulatory Visit (INDEPENDENT_AMBULATORY_CARE_PROVIDER_SITE_OTHER): Payer: BLUE CROSS/BLUE SHIELD | Admitting: Cardiology

## 2017-01-03 ENCOUNTER — Encounter: Payer: Self-pay | Admitting: Cardiology

## 2017-01-03 VITALS — BP 142/90 | HR 71 | Resp 16 | Ht 67.0 in | Wt 218.6 lb

## 2017-01-03 DIAGNOSIS — R42 Dizziness and giddiness: Secondary | ICD-10-CM | POA: Diagnosis not present

## 2017-01-03 DIAGNOSIS — R002 Palpitations: Secondary | ICD-10-CM | POA: Diagnosis not present

## 2017-01-03 NOTE — Progress Notes (Signed)
01/03/2017 Kerri Johnson   1955-03-21  [id number]  Primary Physician System, Pcp Not In Primary Cardiologist: Dr. Delton SeeNelson   Reason for Visit/CC: Palpitations and Dizziness  HPI:  Kerri Johnson is a 61 y.o. female who is being seen today for the evaluation of palpitations and dizziness, at the request of her PCP, Dr. Constance GoltzSchoenhoff, [facility].   Pt is a non smoker. Previously resided in [place] before moving to [place] in 2015. She had a cardiac cath while living in [place] and was told she had no blockages. She was referred to Dr. Patty SermonsBrackbill in the fall of 2015 for evaluation of chest pain. He ordered a NST. This was done 12/2013 and was negative for ischemia. EF normal. Study was read by Dr. Delton SeeNelson. Pt lost to f/u.   She now complains for palpitations and dizziness. Dizziness is worse with positional changes. She has had some near syncope, but no frank syncope. She recently comes to her PCP.  Basic labs were obtained.  These results were faxed over to our office for review.  Basic metabolic panel on 12/07/2016 showed normal potassium at 4.2.  Serum creatinine and BUN were also within normal limits.  However no recent CBC or thyroid function study test was obtained.  Last CBC was in March which was normal.  Last TSH was also in March and was within normal limits.  EKG today in clinic today shows sinus rhythm with frequent PACs.  Static vital signs were checked in clinic today and are positive.  Orthostatic VS for the past 24 hrs:  BP- Lying Pulse- Lying BP- Sitting Pulse- Sitting BP- Standing at 0 minutes Pulse- Standing at 0 minutes  01/03/17 0923 142/90 71 110/70 72 108/68 82   Mitts that she stays well hydrated with fluids.  She denies any excessive caffeine intake.  She denies any alcohol use.  She also denies any recent nausea.  No melena.  Although she does get dizzy with positional changes she denies any syncope or near syncope.  She also has a lot of varicose veins and was instructed in the past to  wear compression stockings however she does not currently wear them.  Denies chest pain and dyspnea.    Current Meds  Medication Sig  . acetaminophen (TYLENOL) 325 MG tablet Take 650 mg by mouth every 6 (six) hours as needed for moderate pain or headache.   . albuterol (VENTOLIN HFA) 108 (90 Base) MCG/ACT inhaler Inhale 2 puffs into the lungs every 6 (six) hours as needed for wheezing or shortness of breath.  . beclomethasone (QVAR REDIHALER) 80 MCG/ACT inhaler Inhale 2 puffs into the lungs 2 (two) times daily.  . Cholecalciferol (VITAMIN D3 PO) Take 1 capsule by mouth daily.  . cyclobenzaprine (FLEXERIL) 10 MG tablet Take 1 tablet (10 mg total) by mouth at bedtime as needed for muscle spasms.   Allergies  Allergen Reactions  . Iodine Anaphylaxis  . Shellfish Allergy Anaphylaxis  . Versed [Midazolam] Other (See Comments)    Effects memory   Past Medical History:  Diagnosis Date  . Asthma   . Colon polyps   . GERD (gastroesophageal reflux disease)   . Hypertension   . Thyroid nodule    dx 2009   Family History  Problem Relation Age of Onset  . Diabetes Mother   . Heart disease Mother   . Hypertension Mother   . Diabetes Sister   . Hypertension Sister   . Diabetes Sister    Past Surgical History:  Procedure Laterality Date  . ABDOMINAL HYSTERECTOMY    . CESAREAN SECTION    . THYROIDECTOMY, PARTIAL     Social History   Social History  . Marital status: Married    Spouse name: N/A  . Number of children: 3  . Years of education: N/A   Occupational History  . Not on file.   Social History Main Topics  . Smoking status: Never Smoker  . Smokeless tobacco: Never Used  . Alcohol use No  . Drug use: No  . Sexual activity: Not on file   Other Topics Concern  . Not on file   Social History Narrative  . No narrative on file     Review of Systems: General: negative for chills, fever, night sweats or weight changes.  Cardiovascular: negative for chest pain,  dyspnea on exertion, edema, orthopnea, palpitations, paroxysmal nocturnal dyspnea or shortness of breath Dermatological: negative for rash Respiratory: negative for cough or wheezing Urologic: negative for hematuria Abdominal: negative for nausea, vomiting, diarrhea, bright red blood per rectum, melena, or hematemesis Neurologic: negative for visual changes, syncope, or dizziness All other systems reviewed and are otherwise negative except as noted above.   Physical Exam:  Blood pressure (!) 142/90, pulse 71, resp. rate 16, height 5\' 7"  (1.702 m), weight 218 lb 9.6 oz (99.2 kg), SpO2 96 %.  General appearance: alert, cooperative and no distress Neck: no carotid bruit and no JVD Lungs: clear to auscultation bilaterally Heart: regular rate and rhythm, S1, S2 normal, no murmur, click, rub or gallop Extremities: extremities normal, atraumatic, no cyanosis or edema Pulses: 2+ and symmetric Skin: Skin color, texture, turgor normal. No rashes or lesions Neurologic: Grossly normal  EKG NSR with PACs  -- personally reviewed   ASSESSMENT AND PLAN:   1.  Palpitations: She had notes history of frequent palpitations.  Her EKG shows sinus rhythm with PACs.  She also reports symptoms of dizziness and near syncope although her orthostatic hypotension might be contributing some.  Also concerns regarding possible atrial fibrillation.  We will have the patient wear a 30-day cardiac monitor to assess for other cardiac arrhythmias, hides PACs.  We will also obtain a 2D echocardiogram to assess for structural abnormalities, LV function and wall motion abnormalities.  Also check a TSH (reports history of partial thyroidectomy in the past).  She is note on any hormone replacement currently.  2. Orthostatic hypotension: Orthostatic VS for the past 24 hrs:  BP- Lying Pulse- Lying BP- Sitting Pulse- Sitting BP- Standing at 0 minutes Pulse- Standing at 0 minutes  01/03/17 0923 142/90 71 110/70 72 108/68 82    Recent basic metabolic panel on 12/07/2016 was within normal limits however her last CBC was in March.  We will repeat a CBC to ensure no anemia to r/o volume depletion. Pt urged to stay well hydrated with fluids and to start back wearing compression stockings as previously advised.  Patient also instructed to use caution when changing positions from lying to sitting, sitting to standing.  We will  reassess patient in several more weeks.  If she continues to be orthostatic and if no reversible causes are identified we will consider addition of Midodrine.    Follow-Up after monitor and 2D echo.   Brittainy Delmer Islam, MHS [facility] 01/03/2017 9:26 AM

## 2017-01-03 NOTE — Patient Instructions (Addendum)
Medication Instructions:   Your physician recommends that you continue on your current medications as directed. Please refer to the Current Medication list given to you today.   If you need a refill on your cardiac medications before your next appointment, please call your pharmacy.  Labwork: NONE ORDERED  TODAY    Testing/Procedures: Your physician has requested that you have an echocardiogram. Echocardiography is a painless test that uses sound waves to create images of your heart. It provides your doctor with information about the size and shape of your heart and how well your heart's chambers and valves are working. This procedure takes approximately one hour. There are no restrictions for this procedure.  Your physician has recommended that you wear an event monitor. Event monitors are medical devices that record the heart's electrical activity. Doctors most often us these monitors to diagnose arrhythmias. Arrhythmias are problems with the speed or rhythm of the heartbeat. The monitor is a small, portable device. You can wear one while you do your normal daily activities. This is usually used to diagnose what is causing palpitations/syncope (passing out).    Follow-Up:  6 WEEKS WITH SIMMONS OR NELSON   Any Other Special Instructions Will Be Listed Below (If Applicable).

## 2017-01-13 ENCOUNTER — Other Ambulatory Visit: Payer: Self-pay

## 2017-01-13 ENCOUNTER — Ambulatory Visit (INDEPENDENT_AMBULATORY_CARE_PROVIDER_SITE_OTHER): Payer: BLUE CROSS/BLUE SHIELD

## 2017-01-13 ENCOUNTER — Ambulatory Visit (HOSPITAL_COMMUNITY): Payer: BLUE CROSS/BLUE SHIELD | Attending: Cardiology

## 2017-01-13 DIAGNOSIS — R002 Palpitations: Secondary | ICD-10-CM

## 2017-01-13 DIAGNOSIS — R42 Dizziness and giddiness: Secondary | ICD-10-CM

## 2017-01-15 DIAGNOSIS — R002 Palpitations: Secondary | ICD-10-CM | POA: Diagnosis not present

## 2017-01-17 ENCOUNTER — Telehealth: Payer: Self-pay | Admitting: Cardiology

## 2017-01-17 NOTE — Telephone Encounter (Signed)
Returned pts call and she has been made aware of her echo results. See result note. 

## 2017-01-17 NOTE — Telephone Encounter (Signed)
-----   Message from Loa SocksIvy M Martin, LPN sent at 16/1/096011/11/2016  4:00 PM EST -----   ----- Message ----- From: Allayne ButcherSimmons, Brittainy M, PA-C Sent: 01/14/2017   3:15 PM To: [facility] Triage  - Normal LV size and systolic function, EF 60-65%. Normal RV size and systolic function. No significant valvular abnormalities.

## 2017-01-17 NOTE — Telephone Encounter (Signed)
Patient returning call for echo results. 

## 2017-01-18 DIAGNOSIS — R0989 Other specified symptoms and signs involving the circulatory and respiratory systems: Secondary | ICD-10-CM | POA: Diagnosis not present

## 2017-01-18 DIAGNOSIS — J203 Acute bronchitis due to coxsackievirus: Secondary | ICD-10-CM | POA: Diagnosis not present

## 2017-01-18 DIAGNOSIS — R05 Cough: Secondary | ICD-10-CM | POA: Diagnosis not present

## 2017-01-18 DIAGNOSIS — J209 Acute bronchitis, unspecified: Secondary | ICD-10-CM | POA: Diagnosis not present

## 2017-02-08 DIAGNOSIS — R42 Dizziness and giddiness: Secondary | ICD-10-CM | POA: Diagnosis not present

## 2017-02-08 DIAGNOSIS — J203 Acute bronchitis due to coxsackievirus: Secondary | ICD-10-CM | POA: Diagnosis not present

## 2017-02-08 DIAGNOSIS — R002 Palpitations: Secondary | ICD-10-CM | POA: Diagnosis not present

## 2017-02-08 DIAGNOSIS — I951 Orthostatic hypotension: Secondary | ICD-10-CM | POA: Diagnosis not present

## 2017-02-22 ENCOUNTER — Telehealth: Payer: Self-pay | Admitting: *Deleted

## 2017-02-22 MED ORDER — METOPROLOL TARTRATE 25 MG PO TABS: 12.5000 mg | ORAL_TABLET | Freq: Two times a day (BID) | ORAL | 1 refills | Status: DC

## 2017-02-22 NOTE — Telephone Encounter (Signed)
-----   Message from Lars MassonKatarina H Nelson, MD sent at 02/21/2017  5:09 PM EST ----- Sinus rhythm to sinus tachycardia, PACs but no runs and no significant pauses. Symptoms of dizziness and syncope don't correlate with any arrhythmia, symptoms of palpitations correlate with PACs. Begin trying a very low dose of beta blocker to see if her symptoms improve I would suggest metoprolol 12.5 mg by mouth twice a day.

## 2017-02-22 NOTE — Telephone Encounter (Signed)
Notified the pt of her event monitor results and recommendations per Dr Delton SeeNelson.  Informed the pt that she has no arrhythmias, which means her symptoms of dizziness and syncope do not correlate.  Informed the pt that she did go from sinus rhythm to sinus tach, with PAC's but no runs and no significant pauses.  Informed the pt that Dr Delton SeeNelson recommends that we try her on a very low dose of beta blocker to see if her symptoms improve.  Informed the pt that she recommends that we start her on metoprolol tartrate 12.5 mg po bid.  Confirmed the pharmacy of choice with the pt.  Pt verbalized understanding and agrees with this plan.

## 2017-03-03 ENCOUNTER — Encounter: Payer: Self-pay | Admitting: Cardiology

## 2017-03-03 ENCOUNTER — Ambulatory Visit: Payer: BLUE CROSS/BLUE SHIELD | Admitting: Cardiology

## 2017-03-03 VITALS — BP 110/78 | HR 98 | Ht 67.0 in | Wt 219.1 lb

## 2017-03-03 DIAGNOSIS — I491 Atrial premature depolarization: Secondary | ICD-10-CM | POA: Diagnosis not present

## 2017-03-03 NOTE — Patient Instructions (Addendum)
Medication Instructions:  START Metoprolol 12.5 mg (1/2 tablet)  by mouth twice daily.   Labwork: None ordered  Testing/Procedures: None ordered  Follow-Up: Your physician recommends that you schedule a follow-up appointment with Dr. Delton SeeNelson first available   Any Other Special Instructions Will Be Listed Below (If Applicable).  Monitor your blood pressure at home closely. If your systolic blood pressure (the top number) drops below 100, stop taking the Metoprolol and call our office for further instructions at [phone]    If you need a refill on your cardiac medications before your next appointment, please call your pharmacy.

## 2017-03-03 NOTE — Progress Notes (Signed)
03/03/2017 Kerri Johnson   1955/04/30  [id number]  Primary Physician Constance GoltzSchoenhoff, Harrington Challengereborah D, MD Primary Cardiologist: Dr. Delton SeeNelson  Reason for Visit/CC: f/u for PACS  HPI:   Kerri Johnson is a 61 y.o. female who is being seen today for the evaluation of palpitations and dizziness. I evaluated her on 01/03/17 for this.   Pt is a non smoker. Previously resided in [place] before moving to  in [place]. She had a cardiac cath while living in [place] and was told she had no blockages. She was referred to Dr. Patty SermonsBrackbill in the fall of 2015 for evaluation of chest pain. He ordered a NST. This was done 12/2013 and was negative for ischemia. EF normal. Study was read by Dr. Delton SeeNelson. Pt lost to f/u.   I evaluated her on 01/03/17 for palpitations. She was set up with a 30 day monitor and 2D echo. Echo was normal. EF normal. No valve abnormalities. 30 day monitor showed PACs and occasional sinus tach but no other arrhythmias.   Dr. Delton SeeNelson recommended pt start low dose metoprolol 12.5 mg BID. Pt has not yet started this. She wanted to wait until today's appt to further discuss.   She is back with her husband. She continues to have frequent palpitations. Resting HR is 98 bpm. BP is 110/78. No chest pain or dyspnea.    Current Meds  Medication Sig  . acetaminophen (TYLENOL) 325 MG tablet Take 650 mg by mouth every 6 (six) hours as needed for moderate pain or headache.   . albuterol (VENTOLIN HFA) 108 (90 Base) MCG/ACT inhaler Inhale 2 puffs into the lungs every 6 (six) hours as needed for wheezing or shortness of breath.  . beclomethasone (QVAR REDIHALER) 80 MCG/ACT inhaler Inhale 2 puffs into the lungs 2 (two) times daily.  . Cholecalciferol (VITAMIN D3 PO) Take 1 capsule by mouth daily.  . cyclobenzaprine (FLEXERIL) 10 MG tablet Take 1 tablet (10 mg total) by mouth at bedtime as needed for muscle spasms.   Allergies  Allergen Reactions  . Iodine Anaphylaxis  . Shellfish Allergy Anaphylaxis  . Versed  [Midazolam] Other (See Comments)    Effects memory   Past Medical History:  Diagnosis Date  . Asthma   . Colon polyps   . GERD (gastroesophageal reflux disease)   . Hypertension   . Thyroid nodule    dx 2009   Family History  Problem Relation Age of Onset  . Diabetes Mother   . Heart disease Mother   . Hypertension Mother   . Diabetes Sister   . Hypertension Sister   . Diabetes Sister    Past Surgical History:  Procedure Laterality Date  . ABDOMINAL HYSTERECTOMY    . CESAREAN SECTION    . THYROIDECTOMY, PARTIAL     Social History   Socioeconomic History  . Marital status: Married    Spouse name: Not on file  . Number of children: 3  . Years of education: Not on file  . Highest education level: Not on file  Social Needs  . Financial resource strain: Not on file  . Food insecurity - worry: Not on file  . Food insecurity - inability: Not on file  . Transportation needs - medical: Not on file  . Transportation needs - non-medical: Not on file  Occupational History  . Not on file  Tobacco Use  . Smoking status: Never Smoker  . Smokeless tobacco: Never Used  Substance and Sexual Activity  . Alcohol use: No  . Drug  use: No  . Sexual activity: Not on file  Other Topics Concern  . Not on file  Social History Narrative  . Not on file     Review of Systems: General: negative for chills, fever, night sweats or weight changes.  Cardiovascular: negative for chest pain, dyspnea on exertion, edema, orthopnea, palpitations, paroxysmal nocturnal dyspnea or shortness of breath Dermatological: negative for rash Respiratory: negative for cough or wheezing Urologic: negative for hematuria Abdominal: negative for nausea, vomiting, diarrhea, bright red blood per rectum, melena, or hematemesis Neurologic: negative for visual changes, syncope, or dizziness All other systems reviewed and are otherwise negative except as noted above.   Physical Exam:  Blood pressure 110/78,  pulse 98, height 5\' 7"  (1.702 m), weight 219 lb 1.9 oz (99.4 kg), SpO2 98 %.  General appearance: alert, cooperative and no distress Neck: no carotid bruit and no JVD Lungs: clear to auscultation bilaterally Heart: regular rate and rhythm, S1, S2 normal, no murmur, click, rub or gallop Extremities: extremities normal, atraumatic, no cyanosis or edema Pulses: 2+ and symmetric Skin: Skin color, texture, turgor normal. No rashes or lesions Neurologic: Grossly normal  EKG not performed -- personally reviewed   ASSESSMENT AND PLAN:   1. PACs: noted on recent heart monitor. Also occasional sinus tach. Pt is symptomatic. Resting HR 98 bpm. BP is 110/78. Pt advised to try metoprolol 12.5 mg BID. Recent echo showed normal LVF and no valve abnormalities. Labs at PCP office WNL.   3. H/o Orthostatic Hypotension: asymptomatic today. BP is stable. Still with occasional dizziness if she stands too quickly. Pt advised to get up from seated positions slowly, stay well hydrated with fluids, wear compression stockings and to add a little bit of salt to diet. She will monitor BP closely while starting metoprolol for symptomatic PACs and will notify our office if she has any issues.   Follow-Up w/ Dr. Delton SeeNelson in 3 months.   Asencion Guisinger Delmer IslamSimmons PA-C, MHS [facility] 03/03/2017 9:10 AM

## 2017-03-10 ENCOUNTER — Other Ambulatory Visit: Payer: Self-pay | Admitting: Internal Medicine

## 2017-03-10 ENCOUNTER — Telehealth: Payer: Self-pay | Admitting: Internal Medicine

## 2017-03-10 MED ORDER — PREDNISONE 10 MG PO TABS: ORAL_TABLET | ORAL | 0 refills | Status: DC

## 2017-03-10 MED ORDER — AZITHROMYCIN 250 MG PO TABS: ORAL_TABLET | ORAL | 0 refills | Status: DC

## 2017-03-10 NOTE — Telephone Encounter (Signed)
rec metaprolol in low doses is probably ok and would not explain the green mucus but if not better rec bisoprolol instead but would need to be seen for this  For now rec  zpak Prednisone 10 mg take  4 each am x 2 days,   2 each am x 2 days,  1 each am x 2 days and stop  Ov if not better

## 2017-03-10 NOTE — Telephone Encounter (Signed)
Called and spoke with pt.  Pt reports of increased sob, wheezing,chills & prod cough with green mucus x2d. Pt feels that's he may has has fever this am, but did not measure temp.  Denies sweats or body aches.  Pt states she is taken ventolin q4h with no relief.  Pt wanted to make MW aware that she started Metoprolol about one week ago for palpitations   MW please advise.   11/29/16 AVS Instructions   No change in medications   Please schedule a follow up visit in 12 months but call sooner if needed

## 2017-03-11 NOTE — Telephone Encounter (Signed)
ATC pt, no answer. Left message for pt to call back.  MW sent the medications in already to her pharmacy.

## 2017-03-11 NOTE — Telephone Encounter (Signed)
Spoke with pt, she states she received her medications. Nothing further is needed.

## 2017-03-11 NOTE — Telephone Encounter (Signed)
Pt is calling back [phone]

## 2017-04-11 ENCOUNTER — Encounter: Payer: Self-pay | Admitting: Cardiology

## 2017-04-11 ENCOUNTER — Encounter (INDEPENDENT_AMBULATORY_CARE_PROVIDER_SITE_OTHER): Payer: Self-pay

## 2017-04-11 ENCOUNTER — Ambulatory Visit: Payer: BLUE CROSS/BLUE SHIELD | Admitting: Cardiology

## 2017-04-11 VITALS — BP 130/70 | HR 82 | Ht 67.0 in | Wt 222.8 lb

## 2017-04-11 DIAGNOSIS — R002 Palpitations: Secondary | ICD-10-CM | POA: Diagnosis not present

## 2017-04-11 DIAGNOSIS — I491 Atrial premature depolarization: Secondary | ICD-10-CM

## 2017-04-11 DIAGNOSIS — I10 Essential (primary) hypertension: Secondary | ICD-10-CM | POA: Diagnosis not present

## 2017-04-11 DIAGNOSIS — I951 Orthostatic hypotension: Secondary | ICD-10-CM

## 2017-04-11 NOTE — Patient Instructions (Signed)
Medication Instructions:   STOP TAKING METOPROLOL NOW    Follow-Up:  Your physician wants you to follow-up in: 6 MONTHS WITH DR NELSON You will receive a reminder letter in the mail two months in advance. If you don't receive a letter, please call our office to schedule the follow-up appointment.       If you need a refill on your cardiac medications before your next appointment, please call your pharmacy.   

## 2017-04-11 NOTE — Progress Notes (Signed)
04/11/2017 Kerri Johnson   [birth year]  [id number]  Primary Physician Constance Goltz, Harrington Challenger, MD Primary Cardiologist: Dr. Delton See  Reason for Visit/CC: f/u for PACS  HPI:   Kerri Johnson is a 62 y.o. female who is being seen today for the evaluation of palpitations and dizziness. I evaluated her on 01/03/17 for this.   Pt is a non smoker. Previously resided in [place] before moving to [place] in 2015. She had a cardiac cath while living in [place] and was told she had no blockages. She was referred to Dr. Patty Sermons in the fall of 2015 for evaluation of chest pain. He ordered a NST. This was done 12/2013 and was negative for ischemia. EF normal. Study was read by Dr. Delton See. Pt lost to f/u.   I evaluated her on 01/03/17 for palpitations. She was set up with a 30 day monitor and 2D echo. Echo was normal. EF normal. No valve abnormalities. 30 day monitor showed PACs and occasional sinus tach but no other arrhythmias.   Dr. Delton See recommended pt start low dose metoprolol 12.5 mg BID. Pt has not yet started this. She wanted to wait until today's appt to further discuss.   She is back with her husband. She continues to have frequent palpitations. Resting HR is 98 bpm. BP is 110/78. No chest pain or dyspnea.  04/11/2017 - 6 weeks follow up, the patient discontinue metoprolol as it made her feel significantly tired, she continues to have some skipped beats but they are improved, no dizziness or syncope. She exercises on a regular basis.  Current Meds  Medication Sig  . acetaminophen (TYLENOL) 325 MG tablet Take 650 mg by mouth every 6 (six) hours as needed for moderate pain or headache.   . albuterol (VENTOLIN HFA) 108 (90 Base) MCG/ACT inhaler Inhale 2 puffs into the lungs every 6 (six) hours as needed for wheezing or shortness of breath.  . beclomethasone (QVAR) 80 MCG/ACT inhaler Inhale 2 puffs into the lungs at bedtime.  . Cholecalciferol (VITAMIN D3 PO) Take 1 capsule by mouth daily.  . cyclobenzaprine  (FLEXERIL) 10 MG tablet Take 1 tablet (10 mg total) by mouth at bedtime as needed for muscle spasms.   Allergies  Allergen Reactions  . Iodine Anaphylaxis  . Shellfish Allergy Anaphylaxis  . Versed [Midazolam] Other (See Comments)    Effects memory   Past Medical History:  Diagnosis Date  . Asthma   . Colon polyps   . GERD (gastroesophageal reflux disease)   . Hypertension   . Thyroid nodule    dx 2009   Family History  Problem Relation Age of Onset  . Diabetes Mother   . Heart disease Mother   . Hypertension Mother   . Diabetes Sister   . Hypertension Sister   . Diabetes Sister    Past Surgical History:  Procedure Laterality Date  . ABDOMINAL HYSTERECTOMY    . CESAREAN SECTION    . THYROIDECTOMY, PARTIAL     Social History   Socioeconomic History  . Marital status: Married    Spouse name: Not on file  . Number of children: 3  . Years of education: Not on file  . Highest education level: Not on file  Social Needs  . Financial resource strain: Not on file  . Food insecurity - worry: Not on file  . Food insecurity - inability: Not on file  . Transportation needs - medical: Not on file  . Transportation needs - non-medical: Not on file  Occupational  History  . Not on file  Tobacco Use  . Smoking status: Never Smoker  . Smokeless tobacco: Never Used  Substance and Sexual Activity  . Alcohol use: No  . Drug use: No  . Sexual activity: Not on file  Other Topics Concern  . Not on file  Social History Narrative  . Not on file     Review of Systems: General: negative for chills, fever, night sweats or weight changes.  Cardiovascular: negative for chest pain, dyspnea on exertion, edema, orthopnea, palpitations, paroxysmal nocturnal dyspnea or shortness of breath Dermatological: negative for rash Respiratory: negative for cough or wheezing Urologic: negative for hematuria Abdominal: negative for nausea, vomiting, diarrhea, bright red blood per rectum, melena,  or hematemesis Neurologic: negative for visual changes, syncope, or dizziness All other systems reviewed and are otherwise negative except as noted above.   Physical Exam:  Blood pressure 130/70, pulse 82, height 5\' 7"  (1.702 m), weight 222 lb 12.8 oz (101.1 kg).  General appearance: alert, cooperative and no distress Neck: no carotid bruit and no JVD Lungs: clear to auscultation bilaterally Heart: regular rate and rhythm, S1, S2 normal, no murmur, click, rub or gallop Extremities: extremities normal, atraumatic, no cyanosis or edema Pulses: 2+ and symmetric Skin: Skin color, texture, turgor normal. No rashes or lesions Neurologic: Grossly normal  EKG SR, sinus arrhythmia, unchanged from prior- personally reviewed     ASSESSMENT AND PLAN:   1. Palpitations/PACs: noted on recent heart monitor. Also occasional sinus tach. Improved symptoms with hydration and limiting caffeine intake. Recent echo showed normal LVEF and no valve abnormalities. Labs at PCP office WNL. I have recommended an electrolytes drink to replete electrolytes after exercise.  2. H/o Orthostatic Hypotension: asymptomatic today. Improved with hydration.  Follow-Up w/ Dr. Delton SeeNelson in 6 months.   Tobias AlexanderKatarina Selma Mink, MD [facility] 04/11/2017 4:37 PM

## 2017-04-20 ENCOUNTER — Other Ambulatory Visit: Payer: Self-pay

## 2017-04-20 MED ORDER — BECLOMETHASONE DIPROP HFA 80 MCG/ACT IN AERB: 2.0000 | INHALATION_SPRAY | Freq: Two times a day (BID) | RESPIRATORY_TRACT | 3 refills | Status: DC

## 2017-05-02 ENCOUNTER — Other Ambulatory Visit: Payer: Self-pay | Admitting: Surgery

## 2017-05-02 DIAGNOSIS — N6320 Unspecified lump in the left breast, unspecified quadrant: Secondary | ICD-10-CM

## 2017-05-02 DIAGNOSIS — N6042 Mammary duct ectasia of left breast: Secondary | ICD-10-CM | POA: Diagnosis not present

## 2017-05-10 ENCOUNTER — Ambulatory Visit
Admission: RE | Admit: 2017-05-10 | Discharge: 2017-05-10 | Disposition: A | Discharge status: Home / Self Care | Payer: BLUE CROSS/BLUE SHIELD | Source: Ambulatory Visit | Attending: Surgery | Admitting: Surgery

## 2017-05-10 DIAGNOSIS — N6320 Unspecified lump in the left breast, unspecified quadrant: Secondary | ICD-10-CM

## 2017-05-10 DIAGNOSIS — N644 Mastodynia: Secondary | ICD-10-CM | POA: Diagnosis not present

## 2017-05-10 DIAGNOSIS — R928 Other abnormal and inconclusive findings on diagnostic imaging of breast: Secondary | ICD-10-CM | POA: Diagnosis not present

## 2017-05-30 DIAGNOSIS — E041 Nontoxic single thyroid nodule: Secondary | ICD-10-CM | POA: Diagnosis not present

## 2017-05-30 DIAGNOSIS — Z0000 Encounter for general adult medical examination without abnormal findings: Secondary | ICD-10-CM | POA: Diagnosis not present

## 2017-05-30 DIAGNOSIS — Z23 Encounter for immunization: Secondary | ICD-10-CM | POA: Diagnosis not present

## 2017-07-29 ENCOUNTER — Other Ambulatory Visit: Payer: Self-pay | Admitting: Pulmonary Disease

## 2017-08-30 DIAGNOSIS — S50861A Insect bite (nonvenomous) of right forearm, initial encounter: Secondary | ICD-10-CM | POA: Diagnosis not present

## 2017-08-30 DIAGNOSIS — W57XXXA Bitten or stung by nonvenomous insect and other nonvenomous arthropods, initial encounter: Secondary | ICD-10-CM | POA: Diagnosis not present

## 2017-11-01 DIAGNOSIS — M7671 Peroneal tendinitis, right leg: Secondary | ICD-10-CM | POA: Diagnosis not present

## 2017-11-01 DIAGNOSIS — M659 Synovitis and tenosynovitis, unspecified: Secondary | ICD-10-CM | POA: Diagnosis not present

## 2017-11-01 DIAGNOSIS — M67472 Ganglion, left ankle and foot: Secondary | ICD-10-CM | POA: Diagnosis not present

## 2017-11-08 DIAGNOSIS — M67471 Ganglion, right ankle and foot: Secondary | ICD-10-CM | POA: Diagnosis not present

## 2017-11-08 DIAGNOSIS — M659 Synovitis and tenosynovitis, unspecified: Secondary | ICD-10-CM | POA: Diagnosis not present

## 2017-11-14 DIAGNOSIS — N6042 Mammary duct ectasia of left breast: Secondary | ICD-10-CM | POA: Diagnosis not present

## 2017-11-15 ENCOUNTER — Other Ambulatory Visit: Payer: Self-pay | Admitting: Surgery

## 2017-11-15 DIAGNOSIS — N6042 Mammary duct ectasia of left breast: Secondary | ICD-10-CM

## 2017-11-24 ENCOUNTER — Inpatient Hospital Stay
Admission: RE | Admit: 2017-11-24 | Discharge: 2017-11-24 | Disposition: A | Discharge status: Home / Self Care | Payer: BLUE CROSS/BLUE SHIELD | Source: Ambulatory Visit | Attending: Surgery | Admitting: Surgery

## 2017-11-24 DIAGNOSIS — M25512 Pain in left shoulder: Secondary | ICD-10-CM | POA: Diagnosis not present

## 2017-11-28 ENCOUNTER — Ambulatory Visit: Payer: BLUE CROSS/BLUE SHIELD | Admitting: Internal Medicine

## 2017-11-28 ENCOUNTER — Encounter: Payer: Self-pay | Admitting: Internal Medicine

## 2017-11-28 VITALS — BP 142/90 | HR 60 | Ht 64.75 in | Wt 229.0 lb

## 2017-11-28 DIAGNOSIS — Z23 Encounter for immunization: Secondary | ICD-10-CM

## 2017-11-28 DIAGNOSIS — J4540 Moderate persistent asthma, uncomplicated: Secondary | ICD-10-CM | POA: Diagnosis not present

## 2017-11-28 NOTE — Patient Instructions (Addendum)
At the slightest suggestion of a flare of respiratory symptoms from any cause rec increase qvar to Take 2 puffs first thing in am and then another 2 puffs about 12 hours later.    Please schedule a follow up visit in 6  months but call sooner if needed with pfts on return

## 2017-11-28 NOTE — Progress Notes (Addendum)
Subjective:    Patient ID: Kerri Johnson, female   DOB: October 21, 1955    MRN: [id number]    Brief patient profile:  [id number]  yobf never smoker [place] with problems with asthma as long back as she can remember initially just prn rx for  intermittent problems then changed over maint rx since 1990s most recently under care of [facility] doctor = Awanda MinkJohn Bolt in [place] on spiriva/ respimat daily (apparently failed advair) and [place] [facility] referred to pulmonary clinic 05/10/14    History of Present Illness  05/10/2014 1st [facility] office visit/ Kalijah Zeiss   Chief Complaint  Patient presents with  . Advice Only    ref by Dr. Hamilton Caprihao Le; asthma/COPD; needs to est. care since moving here. no SOB/cough  bleach and cold weather cause flares, she's  not typically limited by breathing with 2 flares of asthma in last 6 months requiring proaire up to 3 x daily but not typically not needing any prednisone and no recent flares, happy with spiriva and no recent need for proair rec If not doing well with your reflux symptoms or breathing change prilosec to Take 30- 60 min before your first and last meals of the day  GERD  Diet  Please schedule a follow up visit in 3 months but call sooner if needed.     06/26/2014 f/u ov/Kiptyn Rafuse re: copd vs chronic  asthma/ maint rx with spiriva x 3 years per Dr Donnalee CurryBoldt (pulmonary)  Chief Complaint  Patient presents with  . Acute Visit    Pt c/o increased SOB for the past 5 days- started after they installed a new bathroom in her home. She is also wheezing and having chest tightness. She is using rescue inhaler 4-6 x per day.   using proaire respiclick/ severe hoarseness / did not activate "action plan"  rec Plan A = Automatic  = symbicort 80 Take 2 puffs first thing in am and then another 2 puffs about 12 hours later and continue spiriva for now as well               Pantoprazole (protonix) 40 mg   Take 30-60 min  before first meal of the day and prilosec 20 mg 30 min before supper Plan B  Only use your albutero(proair)  As rescue  Plan C = contingency> Prednisone 10 mg take  4 each am x 2 days,   2 each am x 2 days,  1 each am x 2 days and stop  GERD diet    11/25/2015  f/u ov/Rhyanna Sorce re: asthma on qvar 80 2 q pm / primary is shoenoff  Chief Complaint  Patient presents with  . Follow-up    Breathing is doing well. She rarely uses albuterol. No new co's today/  Not limited by breathing from desired activities   rec Plan A = Automatic =  Qvar 80 Take 2 puffs first thing in am and then another 2 puffs about 12 hours later.  Plan B = Backup Only use your albuterol as a rescue medication Plan C = Crisis - only use your albuterol nebulizer if you first try Plan B    11/28/2017  f/u ov/Denaisha Swango re: chronic asthma Chief Complaint  Patient presents with  . Follow-up    Breathing has been doing well. She is using her qvar 2 puffs at night. She has not had to use her albuterol.   Dyspnea:  Not limited  by breathing from desired activities  But  By R ankle pain / "cyst being injected"  Cough: none Sleeping: R side/ 2 pillows  SABA use: none  02: none     No obvious day to day or daytime variability or assoc excess/ purulent sputum or mucus plugs or hemoptysis or cp or chest tightness, subjective wheeze or overt sinus or hb symptoms.   Sleeping as above  without nocturnal  or early am exacerbation  of respiratory  c/o's or need for noct saba. Also denies any obvious fluctuation of symptoms with weather or environmental changes or other aggravating or alleviating factors except as outlined above   No unusual exposure hx or h/o childhood pna/ asthma or knowledge of premature birth.  Current Allergies, Complete Past Medical History, Past Surgical History, Family History, and Social History were reviewed in [facility] electronic medical record.  ROS  The following are not active complaints unless  bolded Hoarseness, sore throat, dysphagia, dental problems, itching, sneezing,  nasal congestion or discharge of excess mucus or purulent secretions, ear ache,   fever, chills, sweats, unintended wt loss or wt gain, classically pleuritic or exertional cp,  orthopnea pnd or arm/hand swelling  or leg swelling, presyncope, palpitations, abdominal pain, anorexia, nausea, vomiting, diarrhea  or change in bowel habits or change in bladder habits, change in stools or change in urine, dysuria, hematuria,  rash, arthralgias, visual complaints, headache, numbness, weakness or ataxia or problems with walking or coordination,  change in mood or  memory.        Current Meds  Medication Sig  . acetaminophen (TYLENOL) 325 MG tablet Take 650 mg by mouth every 6 (six) hours as needed for moderate pain or headache.   . albuterol (VENTOLIN HFA) 108 (90 Base) MCG/ACT inhaler Inhale 2 puffs into the lungs every 6 (six) hours as needed for wheezing or shortness of breath.  Marland Kitchen. QVAR REDIHALER 80 MCG/ACT inhaler INHALE 2 PUFFS BY MOUTH TWICE DAILY                 Objective:   Physical Exam     06/26/2014        222 >  09/13/2014 222  > 11/25/2015  227 > 11/29/2016  216 > 11/28/2017  229     05/10/14 232 lb (105.235 kg)  04/29/14 227 lb (102.967 kg)  12/20/13 218 lb (98.884 kg)      Vital signs reviewed - Note on arrival 02 sats  98% on RA     HEENT: nl dentition, turbinates bilaterally, and oropharynx. Nl external ear canals without cough reflex   NECK :  without JVD/Nodes/TM/ nl carotid upstrokes bilaterally   LUNGS: no acc muscle use,  Nl contour chest which is clear to A and P bilaterally without cough on insp or exp maneuvers   CV:  RRR  no s3 or murmur or increase in P2, and no edema   ABD:  Obese soft and nontender with nl inspiratory excursion in the supine position. No bruits or organomegaly appreciated, bowel sounds nl  MS:  Nl gait/ ext warm without deformities, calf tenderness, cyanosis or  clubbing L shoulder reduced mobility/ R ankle in brace   SKIN: warm and dry without lesions    NEURO:  alert, approp, nl sensorium with  no motor or cerebellar deficits apparent.       Assessment:

## 2017-11-29 DIAGNOSIS — M19071 Primary osteoarthritis, right ankle and foot: Secondary | ICD-10-CM | POA: Diagnosis not present

## 2017-11-29 DIAGNOSIS — M7751 Other enthesopathy of right foot: Secondary | ICD-10-CM | POA: Diagnosis not present

## 2017-11-29 DIAGNOSIS — M76821 Posterior tibial tendinitis, right leg: Secondary | ICD-10-CM | POA: Diagnosis not present

## 2017-11-30 ENCOUNTER — Encounter: Payer: Self-pay | Admitting: Internal Medicine

## 2017-11-30 DIAGNOSIS — M6281 Muscle weakness (generalized): Secondary | ICD-10-CM | POA: Diagnosis not present

## 2017-11-30 DIAGNOSIS — M7542 Impingement syndrome of left shoulder: Secondary | ICD-10-CM | POA: Diagnosis not present

## 2017-11-30 DIAGNOSIS — M25612 Stiffness of left shoulder, not elsewhere classified: Secondary | ICD-10-CM | POA: Diagnosis not present

## 2017-11-30 DIAGNOSIS — M25512 Pain in left shoulder: Secondary | ICD-10-CM | POA: Diagnosis not present

## 2017-11-30 NOTE — Assessment & Plan Note (Addendum)
-  started on symbicort 80 2bid 06/26/14 > stopped  07/07/14   - PFTs  06/28/14 done during acute flare  FEV1  1.80 (73%) ratio 54 p 38% from bronchodilators and  Near nl dlco - 08/16/2014   try qvar 80 2bid as could not tolerate laba effects - 09/13/2014 p extensive coaching HFA effectiveness =    90%  - spirometry 09/13/2014   FEV1  1.72 (72%) ratio 63 s any bronchodilator  - Spirometry 11/25/2015  FEV1 1.49 (62%)  Ratio 58  s any bronchodilators  - Spirometry 11/28/2017  FEV1 1.5 (69%)  Ratio 74 though did not blow out 6 sec and does show curvature p qvar hs only     All goals of chronic asthma control met including optimal function and elimination of symptoms with minimal need for rescue therapy.  Contingencies discussed in full including contacting this office immediately if not controlling the symptoms using the rule of two's.      I had an extended discussion with the patient reviewing all relevant studies completed to date and  lasting 10  minutes of a 15 minute visit    Each maintenance medication was reviewed in detail including most importantly the difference between maintenance and prns and under what circumstances the prns are to be triggered using an action plan format that is not reflected in the computer generated alphabetically organized AVS.     Please see AVS for specific instructions unique to this visit that I personally wrote and verbalized to the the pt in detail and then reviewed with pt  by my nurse highlighting any  changes in therapy recommended at today's visit to their plan of care.

## 2017-12-06 DIAGNOSIS — M25512 Pain in left shoulder: Secondary | ICD-10-CM | POA: Diagnosis not present

## 2017-12-06 DIAGNOSIS — M6281 Muscle weakness (generalized): Secondary | ICD-10-CM | POA: Diagnosis not present

## 2017-12-06 DIAGNOSIS — M7542 Impingement syndrome of left shoulder: Secondary | ICD-10-CM | POA: Diagnosis not present

## 2017-12-06 DIAGNOSIS — M25612 Stiffness of left shoulder, not elsewhere classified: Secondary | ICD-10-CM | POA: Diagnosis not present

## 2017-12-08 DIAGNOSIS — M6281 Muscle weakness (generalized): Secondary | ICD-10-CM | POA: Diagnosis not present

## 2017-12-08 DIAGNOSIS — M25512 Pain in left shoulder: Secondary | ICD-10-CM | POA: Diagnosis not present

## 2017-12-08 DIAGNOSIS — M7542 Impingement syndrome of left shoulder: Secondary | ICD-10-CM | POA: Diagnosis not present

## 2017-12-08 DIAGNOSIS — M25612 Stiffness of left shoulder, not elsewhere classified: Secondary | ICD-10-CM | POA: Diagnosis not present

## 2017-12-13 DIAGNOSIS — M7542 Impingement syndrome of left shoulder: Secondary | ICD-10-CM | POA: Diagnosis not present

## 2017-12-13 DIAGNOSIS — M6281 Muscle weakness (generalized): Secondary | ICD-10-CM | POA: Diagnosis not present

## 2017-12-13 DIAGNOSIS — M25512 Pain in left shoulder: Secondary | ICD-10-CM | POA: Diagnosis not present

## 2017-12-13 DIAGNOSIS — M25612 Stiffness of left shoulder, not elsewhere classified: Secondary | ICD-10-CM | POA: Diagnosis not present

## 2017-12-15 ENCOUNTER — Ambulatory Visit
Admission: RE | Admit: 2017-12-15 | Discharge: 2017-12-15 | Disposition: A | Discharge status: Home / Self Care | Payer: BLUE CROSS/BLUE SHIELD | Source: Ambulatory Visit | Attending: Surgery | Admitting: Surgery

## 2017-12-15 DIAGNOSIS — N6042 Mammary duct ectasia of left breast: Secondary | ICD-10-CM

## 2017-12-15 DIAGNOSIS — M25612 Stiffness of left shoulder, not elsewhere classified: Secondary | ICD-10-CM | POA: Diagnosis not present

## 2017-12-15 DIAGNOSIS — R928 Other abnormal and inconclusive findings on diagnostic imaging of breast: Secondary | ICD-10-CM | POA: Diagnosis not present

## 2017-12-15 DIAGNOSIS — M25512 Pain in left shoulder: Secondary | ICD-10-CM | POA: Diagnosis not present

## 2017-12-15 DIAGNOSIS — M7542 Impingement syndrome of left shoulder: Secondary | ICD-10-CM | POA: Diagnosis not present

## 2017-12-15 DIAGNOSIS — M6281 Muscle weakness (generalized): Secondary | ICD-10-CM | POA: Diagnosis not present

## 2017-12-20 DIAGNOSIS — M6281 Muscle weakness (generalized): Secondary | ICD-10-CM | POA: Diagnosis not present

## 2017-12-20 DIAGNOSIS — M25512 Pain in left shoulder: Secondary | ICD-10-CM | POA: Diagnosis not present

## 2017-12-20 DIAGNOSIS — M25612 Stiffness of left shoulder, not elsewhere classified: Secondary | ICD-10-CM | POA: Diagnosis not present

## 2017-12-20 DIAGNOSIS — M7542 Impingement syndrome of left shoulder: Secondary | ICD-10-CM | POA: Diagnosis not present

## 2017-12-26 DIAGNOSIS — M7542 Impingement syndrome of left shoulder: Secondary | ICD-10-CM | POA: Diagnosis not present

## 2017-12-26 DIAGNOSIS — M6281 Muscle weakness (generalized): Secondary | ICD-10-CM | POA: Diagnosis not present

## 2017-12-26 DIAGNOSIS — M25612 Stiffness of left shoulder, not elsewhere classified: Secondary | ICD-10-CM | POA: Diagnosis not present

## 2017-12-26 DIAGNOSIS — M25512 Pain in left shoulder: Secondary | ICD-10-CM | POA: Diagnosis not present

## 2017-12-30 DIAGNOSIS — M25511 Pain in right shoulder: Secondary | ICD-10-CM | POA: Diagnosis not present

## 2018-01-10 DIAGNOSIS — M25512 Pain in left shoulder: Secondary | ICD-10-CM | POA: Diagnosis not present

## 2018-01-10 DIAGNOSIS — M25612 Stiffness of left shoulder, not elsewhere classified: Secondary | ICD-10-CM | POA: Diagnosis not present

## 2018-01-10 DIAGNOSIS — M7542 Impingement syndrome of left shoulder: Secondary | ICD-10-CM | POA: Diagnosis not present

## 2018-01-10 DIAGNOSIS — M6281 Muscle weakness (generalized): Secondary | ICD-10-CM | POA: Diagnosis not present

## 2018-02-19 ENCOUNTER — Other Ambulatory Visit: Payer: Self-pay | Admitting: Internal Medicine

## 2018-02-19 DIAGNOSIS — J4540 Moderate persistent asthma, uncomplicated: Secondary | ICD-10-CM

## 2018-05-26 ENCOUNTER — Other Ambulatory Visit: Payer: Self-pay | Admitting: Internal Medicine

## 2018-05-26 DIAGNOSIS — J4540 Moderate persistent asthma, uncomplicated: Secondary | ICD-10-CM

## 2018-05-26 MED ORDER — BECLOMETHASONE DIPROP HFA 80 MCG/ACT IN AERB: 2.0000 | INHALATION_SPRAY | Freq: Two times a day (BID) | RESPIRATORY_TRACT | 0 refills | Status: DC

## 2018-05-26 MED ORDER — ALBUTEROL SULFATE HFA 108 (90 BASE) MCG/ACT IN AERS: INHALATION_SPRAY | RESPIRATORY_TRACT | 2 refills | Status: AC

## 2018-05-29 ENCOUNTER — Ambulatory Visit: Payer: BLUE CROSS/BLUE SHIELD | Admitting: Internal Medicine

## 2018-06-23 ENCOUNTER — Other Ambulatory Visit: Payer: Self-pay

## 2018-06-23 MED ORDER — BECLOMETHASONE DIPROP HFA 80 MCG/ACT IN AERB: 2.0000 | INHALATION_SPRAY | Freq: Two times a day (BID) | RESPIRATORY_TRACT | 0 refills | Status: DC

## 2018-07-13 DIAGNOSIS — I491 Atrial premature depolarization: Secondary | ICD-10-CM | POA: Diagnosis not present

## 2018-07-13 DIAGNOSIS — N6042 Mammary duct ectasia of left breast: Secondary | ICD-10-CM | POA: Diagnosis not present

## 2018-07-13 DIAGNOSIS — J4520 Mild intermittent asthma, uncomplicated: Secondary | ICD-10-CM | POA: Diagnosis not present

## 2018-07-13 DIAGNOSIS — E041 Nontoxic single thyroid nodule: Secondary | ICD-10-CM | POA: Diagnosis not present

## 2018-07-20 ENCOUNTER — Other Ambulatory Visit: Payer: Self-pay | Admitting: Internal Medicine

## 2018-07-20 MED ORDER — BECLOMETHASONE DIPROP HFA 80 MCG/ACT IN AERB: 2.0000 | INHALATION_SPRAY | Freq: Two times a day (BID) | RESPIRATORY_TRACT | 5 refills | Status: DC

## 2018-08-28 DIAGNOSIS — Z20828 Contact with and (suspected) exposure to other viral communicable diseases: Secondary | ICD-10-CM | POA: Diagnosis not present

## 2018-09-01 ENCOUNTER — Telehealth: Payer: Self-pay | Admitting: Internal Medicine

## 2018-09-01 NOTE — Telephone Encounter (Signed)
Sorry it's not my rule to over rule but I do understand her frustration and I'm certainly happy to see her without the pfts but if doing well we can just put off the f/u until the restrictions are lifted  - if doing poorly I can see her without high tech studies because I'm "old school anyway" but must bring all meds with her to ov to sort out what's going on with her

## 2018-09-01 NOTE — Telephone Encounter (Signed)
Spoke with patient. She would still like to see MW without the PFT. She has been rescheduled for 7/2 at 1115 since the original appt was canceled. She verbalized understanding. Nothing further needed at time of call.

## 2018-09-01 NOTE — Telephone Encounter (Signed)
Spoke with pt. She was scheduled for a PFT and OV with MW on 09/07/2018. Our office contacted the pt to get her scheduled for COVID testing prior to her PFT. Pt declined the test as she just had a test done on Monday of this week. Her results came back negative. Our office advised her that she would still have to be tested again before her PFT could be done. Pt canceled her PFT and OV due to not wanting to be tested again due to her insurance might not cover another COVID test so soon after the first one. She would like MW's recommendation.  MW - please advise.  Thanks.

## 2018-09-04 DIAGNOSIS — R740 Nonspecific elevation of levels of transaminase and lactic acid dehydrogenase [LDH]: Secondary | ICD-10-CM | POA: Diagnosis not present

## 2018-09-04 DIAGNOSIS — J4520 Mild intermittent asthma, uncomplicated: Secondary | ICD-10-CM | POA: Diagnosis not present

## 2018-09-04 DIAGNOSIS — G4733 Obstructive sleep apnea (adult) (pediatric): Secondary | ICD-10-CM | POA: Diagnosis not present

## 2018-09-04 DIAGNOSIS — Z90710 Acquired absence of both cervix and uterus: Secondary | ICD-10-CM | POA: Diagnosis not present

## 2018-09-07 ENCOUNTER — Ambulatory Visit: Payer: BLUE CROSS/BLUE SHIELD | Admitting: Internal Medicine

## 2018-09-07 ENCOUNTER — Other Ambulatory Visit: Payer: Self-pay

## 2018-09-07 ENCOUNTER — Telehealth: Payer: Self-pay | Admitting: Internal Medicine

## 2018-09-07 ENCOUNTER — Encounter: Payer: Self-pay | Admitting: Internal Medicine

## 2018-09-07 ENCOUNTER — Ambulatory Visit: Payer: BC Managed Care – PPO | Admitting: Internal Medicine

## 2018-09-07 DIAGNOSIS — J4540 Moderate persistent asthma, uncomplicated: Secondary | ICD-10-CM

## 2018-09-07 MED ORDER — BECLOMETHASONE DIPROPIONATE 40 MCG/ACT IN AERS: INHALATION_SPRAY | RESPIRATORY_TRACT | 11 refills | Status: DC

## 2018-09-07 MED ORDER — QVAR REDIHALER 40 MCG/ACT IN AERB: 2.0000 | INHALATION_SPRAY | Freq: Two times a day (BID) | RESPIRATORY_TRACT | 11 refills | Status: DC

## 2018-09-07 NOTE — Telephone Encounter (Signed)
rx updated Call placed from pharmacy. Qvar has changed to redihaler. Nothing further needed.

## 2018-09-07 NOTE — Progress Notes (Signed)
Subjective:    Patient ID: Kerri Johnson, female   DOB: October 21, 1955    MRN: [id number]    Brief patient profile:  [id number]  yobf never smoker [place] with problems with asthma as long back as she can remember initially just prn rx for  intermittent problems then changed over maint rx since 1990s most recently under care of [facility] doctor = Awanda MinkJohn Bolt [place] on spiriva/ respimat daily (apparently failed advair) and [place] [facility] referred to pulmonary clinic 05/10/14    History of Present Illness  05/10/2014 1st [facility] office visit/ Kerri Johnson   Chief Complaint  Patient presents with  . Advice Only    ref by Dr. Hamilton Caprihao Le; asthma/COPD; needs to est. care since moving here. no SOB/cough  bleach and cold weather cause flares, she's  not typically limited by breathing with 2 flares of asthma in last 6 months requiring proaire up to 3 x daily but not typically not needing any prednisone and no recent flares, happy with spiriva and no recent need for proair rec If not doing well with your reflux symptoms or breathing change prilosec to Take 30- 60 min before your first and last meals of the day  GERD  Diet  Please schedule a follow up visit in 3 months but call sooner if needed.     06/26/2014 f/u ov/Kerri Johnson re: copd vs chronic  asthma/ maint rx with spiriva x 3 years per Dr Donnalee CurryBoldt (pulmonary)  Chief Complaint  Patient presents with  . Acute Visit    Pt c/o increased SOB for the past 5 days- started after they installed a new bathroom in her home. She is also wheezing and having chest tightness. She is using rescue inhaler 4-6 x per day.   using proaire respiclick/ severe hoarseness / did not activate "action plan"  rec Plan A = Automatic  = symbicort 80 Take 2 puffs first thing in am and then another 2 puffs about 12 hours later and continue spiriva for now as well               Pantoprazole (protonix) 40 mg   Take 30-60 min  before first meal of the day and prilosec 20 mg 30 min before supper Plan B  Only use your albutero(proair)  As rescue  Plan C = contingency> Prednisone 10 mg take  4 each am x 2 days,   2 each am x 2 days,  1 each am x 2 days and stop  GERD diet    11/25/2015  f/u ov/Kerri Johnson re: asthma on qvar 80 2 q pm / primary is shoenoff  Chief Complaint  Patient presents with  . Follow-up    Breathing is doing well. She rarely uses albuterol. No new co's today/  Not limited by breathing from desired activities   rec Plan A = Automatic =  Qvar 80 Take 2 puffs first thing in am and then another 2 puffs about 12 hours later.  Plan B = Backup Only use your albuterol as a rescue medication Plan C = Crisis - only use your albuterol nebulizer if you first try Plan B    11/28/2017  f/u ov/Kerri Johnson re: chronic asthma Chief Complaint  Patient presents with  . Follow-up    Breathing has been doing well. She is using her qvar 2 puffs at night. She has not had to use her albuterol.   Dyspnea:  Not limited  by breathing from desired activities  But  By R ankle pain / "cyst being injected"  Cough: none Sleeping: R side/ 2 pillows  SABA use: none  02: none   rec At the slightest suggestion of a flare of respiratory symptoms from any cause rec increase qvar to Take 2 puffs first thing in am and then another 2 puffs about 12 hours later.     09/07/2018  f/u ov/Kerri Johnson re: chronic asthma  Chief Complaint  Patient presents with  . Follow-up    Breathing is "great" and she rarely uses rescue inhaler.    Dyspnea:  Not limited by breathing from desired activities   Cough: none Sleeping: ok  SABA use: on specific exp 02: none   No obvious day to day or daytime variability or assoc excess/ purulent sputum or mucus plugs or hemoptysis or cp or chest tightness, subjective wheeze or overt sinus or hb symptoms.   Sleeping as above without nocturnal  or early am exacerbation  of respiratory  c/o's or need for noct saba.  Also denies any obvious fluctuation of symptoms with weather or environmental changes or other aggravating or alleviating factors except as outlined above   No unusual exposure hx or h/o childhood pna or knowledge of premature birth.  Current Allergies, Complete Past Medical History, Past Surgical History, Family History, and Social History were reviewed in [facility] electronic medical record.  ROS  The following are not active complaints unless bolded Hoarseness, sore throat, dysphagia, dental problems, itching, sneezing,  nasal congestion or discharge of excess mucus or purulent secretions, ear ache,   fever, chills, sweats, unintended wt loss or wt gain, classically pleuritic or exertional cp,  orthopnea pnd or arm/hand swelling  or leg swelling, presyncope, palpitations, abdominal pain, anorexia, nausea, vomiting, diarrhea  or change in bowel habits or change in bladder habits, change in stools or change in urine, dysuria, hematuria,  rash, arthralgias, visual complaints, headache, numbness, weakness or ataxia or problems with walking or coordination,  change in mood or  memory.        Current Meds  Medication Sig  . acetaminophen (TYLENOL) 325 MG tablet Take 650 mg by mouth every 6 (six) hours as needed for moderate pain or headache.   . albuterol (VENTOLIN HFA) 108 (90 Base) MCG/ACT inhaler ONHALE 2 PUFFS BY MOUTH EVERY 4 HOURS AS NEEDED FOR FOR TROUBLE BREATHING  . beclomethasone (QVAR REDIHALER) 80 MCG/ACT inhaler Inhale 2 puffs into the lungs 2 (two) times daily.  . Cholecalciferol (VITAMIN D3 PO) Take 1 capsule by mouth daily.                      Objective:   Physical Exam    09/07/2018        217  06/26/2014        222 >  09/13/2014 222  > 11/25/2015  227 > 11/29/2016  216 > 11/28/2017  229     05/10/14 232 lb (105.235 kg)  04/29/14 227 lb (102.967 kg)  12/20/13 218 lb (98.884 kg)      Vital signs reviewed - Note on arrival 02 sats  97% on RA     Pleasant amb bm  nad   HEENT: nl dentition, turbinates bilaterally, and oropharynx. Nl external ear canals without cough reflex   NECK :  without JVD/Nodes/TM/ nl carotid upstrokes bilaterally   LUNGS: no acc muscle use,  Nl contour chest which is clear to A and P bilaterally  without cough on insp or exp maneuvers   CV:  RRR  no s3 or murmur or increase in P2, and no edema   ABD:  soft and nontender with nl inspiratory excursion in the supine position. No bruits or organomegaly appreciated, bowel sounds nl  MS:  Nl gait/ ext warm without deformities, calf tenderness, cyanosis or clubbing No obvious joint restrictions   SKIN: warm and dry without lesions    NEURO:  alert, approp, nl sensorium with  no motor or cerebellar deficits apparent.             Assessment:

## 2018-09-07 NOTE — Patient Instructions (Addendum)
Qvar 40 2 pff each pm > for any flare increase to 4 pffs every 12 hours for a week then back down   Only use your albuterol as a rescue medication to be used if you can't catch your breath by resting or doing a relaxed purse lip breathing pattern.  - The less you use it, the better it will work when you need it. - Ok to use up to 2 puffs  every 4 hours if you must but call for immediate appointment if use goes up over your usual need - Don't leave home without it !!  (think of it like the spare tire for your car)    Please schedule a follow up visit in 6  months but call sooner if needed     

## 2018-09-08 ENCOUNTER — Encounter: Payer: Self-pay | Admitting: Internal Medicine

## 2018-09-08 NOTE — Assessment & Plan Note (Signed)
Onset as child in [place] Follow by Dr Queen Slough, [place], until established in pulmonary clinic 05/10/14  - started on symbicort 80 2bid 06/26/14 > stopped  07/07/14   - PFTs  06/28/14 done during acute flare  FEV1  1.80 (73%) ratio 54 p 38% from bronchodilators and  Near nl dlco - 08/16/2014   try qvar 80 2bid as could not tolerate laba effects - 09/13/2014 p extensive coaching HFA effectiveness =    90%  - spirometry 09/13/2014   FEV1  1.72 (72%) ratio 63 s any bronchodilator  - Spirometry 11/25/2015  FEV1 1.49 (62%)  Ratio 58  s any bronchodilators  - Spirometry 11/28/2017  FEV1 1.5 (69%)  Ratio 74 though did not blow out 6 sec and does show curvature p qvar hs only    All goals of chronic asthma control met including optimal function and elimination of symptoms with minimal need for rescue therapy.  Contingencies discussed in full including contacting this office immediately if not controlling the symptoms using the rule of two's.      Would like to try qvar 40 2bid which is fine as long as follows rule of 2's and immediately increases to 4 bid in event of any breakthru symptoms    Each maintenance medication was reviewed in detail including most importantly the difference between maintenance and as needed and under what circumstances the prns are to be used.  Please see AVS for specific  Instructions which are unique to this visit and I personally typed out  which were reviewed in detail in writing with the patient and a copy provided.

## 2018-12-12 ENCOUNTER — Encounter: Payer: Self-pay | Admitting: Internal Medicine

## 2018-12-12 DIAGNOSIS — E041 Nontoxic single thyroid nodule: Secondary | ICD-10-CM | POA: Diagnosis not present

## 2018-12-12 DIAGNOSIS — Z0000 Encounter for general adult medical examination without abnormal findings: Secondary | ICD-10-CM | POA: Diagnosis not present

## 2018-12-12 DIAGNOSIS — Z23 Encounter for immunization: Secondary | ICD-10-CM | POA: Diagnosis not present

## 2018-12-12 DIAGNOSIS — Z6835 Body mass index (BMI) 35.0-35.9, adult: Secondary | ICD-10-CM | POA: Diagnosis not present

## 2018-12-18 ENCOUNTER — Other Ambulatory Visit: Payer: Self-pay | Admitting: Internal Medicine

## 2018-12-18 DIAGNOSIS — Z1231 Encounter for screening mammogram for malignant neoplasm of breast: Secondary | ICD-10-CM

## 2019-01-29 ENCOUNTER — Other Ambulatory Visit: Payer: Self-pay

## 2019-01-29 ENCOUNTER — Ambulatory Visit
Admission: RE | Admit: 2019-01-29 | Discharge: 2019-01-29 | Disposition: A | Discharge status: Home / Self Care | Payer: BC Managed Care – PPO | Source: Ambulatory Visit | Attending: Internal Medicine | Admitting: Internal Medicine

## 2019-01-29 DIAGNOSIS — Z1231 Encounter for screening mammogram for malignant neoplasm of breast: Secondary | ICD-10-CM

## 2019-01-31 ENCOUNTER — Ambulatory Visit: Payer: BC Managed Care – PPO

## 2019-02-20 DIAGNOSIS — Z01818 Encounter for other preprocedural examination: Secondary | ICD-10-CM | POA: Diagnosis not present

## 2019-02-20 DIAGNOSIS — Z86010 Personal history of colonic polyps: Secondary | ICD-10-CM | POA: Diagnosis not present

## 2019-02-20 DIAGNOSIS — Z8371 Family history of colonic polyps: Secondary | ICD-10-CM | POA: Diagnosis not present

## 2019-03-12 ENCOUNTER — Ambulatory Visit: Payer: BC Managed Care – PPO | Admitting: Internal Medicine

## 2019-03-12 ENCOUNTER — Other Ambulatory Visit (HOSPITAL_COMMUNITY)
Admission: RE | Admit: 2019-03-12 | Discharge: 2019-03-12 | Disposition: A | Discharge status: Home / Self Care | Payer: BC Managed Care – PPO | Source: Ambulatory Visit | Attending: Internal Medicine | Admitting: Internal Medicine

## 2019-03-12 DIAGNOSIS — Z20822 Contact with and (suspected) exposure to covid-19: Secondary | ICD-10-CM | POA: Insufficient documentation

## 2019-03-12 DIAGNOSIS — Z01812 Encounter for preprocedural laboratory examination: Secondary | ICD-10-CM | POA: Insufficient documentation

## 2019-03-13 LAB — NOVEL CORONAVIRUS, NAA (HOSP ORDER, SEND-OUT TO REF LAB; TAT 18-24 HRS): SARS-CoV-2, NAA: NOT DETECTED

## 2019-03-14 ENCOUNTER — Other Ambulatory Visit: Payer: Self-pay | Admitting: *Deleted

## 2019-03-14 DIAGNOSIS — J4540 Moderate persistent asthma, uncomplicated: Secondary | ICD-10-CM

## 2019-03-14 NOTE — Progress Notes (Signed)
Spoke with pt and notified of results per Dr. Wert. Pt verbalized understanding and denied any questions. 

## 2019-03-15 ENCOUNTER — Ambulatory Visit (INDEPENDENT_AMBULATORY_CARE_PROVIDER_SITE_OTHER): Payer: BC Managed Care – PPO | Admitting: Internal Medicine

## 2019-03-15 ENCOUNTER — Other Ambulatory Visit: Payer: Self-pay

## 2019-03-15 DIAGNOSIS — J4540 Moderate persistent asthma, uncomplicated: Secondary | ICD-10-CM

## 2019-03-15 LAB — PULMONARY FUNCTION TEST
DL/VA % pred: 114 %
DL/VA: 4.69 ml/min/mmHg/L
DLCO unc % pred: 104 %
DLCO unc: 23.2 ml/min/mmHg
FEF 25-75 Post: 1.12 L/sec
FEF 25-75 Pre: 0.74 L/sec
FEF2575-%Change-Post: 50 %
FEF2575-%Pred-Post: 49 %
FEF2575-%Pred-Pre: 33 %
FEV1-%Change-Post: 13 %
FEV1-%Pred-Post: 70 %
FEV1-%Pred-Pre: 62 %
FEV1-Post: 1.65 L
FEV1-Pre: 1.46 L
FEV1FVC-%Change-Post: -7 %
FEV1FVC-%Pred-Pre: 78 %
FEV6-%Change-Post: 21 %
FEV6-%Pred-Post: 96 %
FEV6-%Pred-Pre: 79 %
FEV6-Post: 2.78 L
FEV6-Pre: 2.29 L
FEV6FVC-%Change-Post: 0 %
FEV6FVC-%Pred-Post: 100 %
FEV6FVC-%Pred-Pre: 100 %
FVC-%Change-Post: 21 %
FVC-%Pred-Post: 96 %
FVC-%Pred-Pre: 78 %
FVC-Post: 2.86 L
FVC-Pre: 2.35 L
Post FEV1/FVC ratio: 58 %
Post FEV6/FVC ratio: 97 %
Pre FEV1/FVC ratio: 62 %
Pre FEV6/FVC Ratio: 97 %
RV % pred: 148 %
RV: 3.2 L
TLC % pred: 106 %
TLC: 5.85 L

## 2019-03-15 NOTE — Progress Notes (Signed)
PFT done today. 

## 2019-03-30 ENCOUNTER — Ambulatory Visit: Payer: BC Managed Care – PPO | Admitting: Internal Medicine

## 2019-04-06 ENCOUNTER — Ambulatory Visit: Payer: BC Managed Care – PPO | Attending: Internal Medicine

## 2019-04-06 DIAGNOSIS — Z20822 Contact with and (suspected) exposure to covid-19: Secondary | ICD-10-CM

## 2019-04-07 ENCOUNTER — Telehealth: Payer: Self-pay | Admitting: Internal Medicine

## 2019-04-07 LAB — NOVEL CORONAVIRUS, NAA: SARS-CoV-2, NAA: NOT DETECTED

## 2019-04-07 NOTE — Telephone Encounter (Signed)
Patient called in and received her negative covid test result  

## 2019-04-09 ENCOUNTER — Telehealth: Payer: Self-pay | Admitting: Internal Medicine

## 2019-04-09 NOTE — Telephone Encounter (Signed)
lmtcb for pt. Per our office protocol, we do not require covid testing prior to regular office visits.  We will do a covid symptom screening the day before.

## 2019-04-10 NOTE — Telephone Encounter (Signed)
Called and spoke with pt. Pt stated that her son has covid and was diagnosed with it about 2 days ago. Stated to pt that we would not need for her to be tested for covid unless she began having any symptoms. Stated to pt that she needed to self-quarantine and closely monitor her symptoms and she verbalized understanding.  Pt did go ahead and change her appt with MW to 3/2. Nothing further needed.

## 2019-04-10 NOTE — Telephone Encounter (Signed)
Patient is returning phone call.  Patient phone number is [phone].

## 2019-04-10 NOTE — Telephone Encounter (Signed)
ATC pt, there was no answer but I could not leave a message due to her VM being full. Will try back.

## 2019-04-12 ENCOUNTER — Ambulatory Visit: Payer: BC Managed Care – PPO | Admitting: Internal Medicine

## 2019-05-04 ENCOUNTER — Telehealth: Payer: Self-pay | Admitting: Internal Medicine

## 2019-05-04 NOTE — Telephone Encounter (Signed)
LMTCB

## 2019-05-07 NOTE — Telephone Encounter (Signed)
Spoke with pt. States that she was exposed to her son who tested positive for COVID. This has been within the last week. Pt questioned if she needed to have a COVID test before her appointment with MW on 05/08/2019. She is not having any symptoms right now. Advised her to the on the safe side, we will change her appointment to a televisit. Pt agreed and verbalized understanding. Nothing further was needed.

## 2019-05-08 ENCOUNTER — Ambulatory Visit (INDEPENDENT_AMBULATORY_CARE_PROVIDER_SITE_OTHER): Payer: BC Managed Care – PPO | Admitting: Internal Medicine

## 2019-05-08 ENCOUNTER — Other Ambulatory Visit: Payer: Self-pay

## 2019-05-08 DIAGNOSIS — J4540 Moderate persistent asthma, uncomplicated: Secondary | ICD-10-CM

## 2019-05-08 NOTE — Patient Instructions (Addendum)
Qvar 40 2 pff each pm > for any flare increase to 4 pffs every 12 hours for a week then back down   Only use your albuterol as a rescue medication to be used if you can't catch your breath by resting or doing a relaxed purse lip breathing pattern.  - The less you use it, the better it will work when you need it. - Ok to use up to 2 puffs  every 4 hours if you must but call for immediate appointment if use goes up over your usual need - Don't leave home without it !!  (think of it like the spare tire for your car)    Please schedule a follow up visit in 6  months but call sooner if needed

## 2019-05-08 NOTE — Progress Notes (Signed)
Subjective:    Patient ID: Kerri Johnson, female   DOB: 1955-07-04    MRN: [id number]    Brief patient profile:  53  yobf never smoker grew up in  [place] with problems with asthma as long back as she can remember initially just prn rx for  intermittent problems then changed over maint rx since 1990s most recently under care of [facility] doctor = [place] in [place] on spiriva/ respimat daily (apparently failed advair) and moved to [facility] summer 2015 referred to pulmonary clinic 05/10/14    History of Present Illness  05/10/2014 1st [facility] office visit/ Kerri Johnson   Chief Complaint  Patient presents with  . Advice Only    ref by Dr. Rikki Spearing; asthma/COPD; needs to est. care since moving here. no SOB/cough  bleach and cold weather cause flares, she's  not typically limited by breathing with 2 flares of asthma in last 6 months requiring proaire up to 3 x daily but not typically not needing any prednisone and no recent flares, happy with spiriva and no recent need for proair rec If not doing well with your reflux symptoms or breathing change prilosec to Take 30- 60 min before your first and last meals of the day  GERD  Diet  Please schedule a follow up visit in 3 months but call sooner if needed.     06/26/2014 f/u ov/Kerri Johnson re: copd vs chronic  asthma/ maint rx with spiriva x 3 years per Dr Odette Fraction (pulmonary)  Chief Complaint  Patient presents with  . Acute Visit    Pt c/o increased SOB for the past 5 days- started after they installed a new bathroom in her home. She is also wheezing and having chest tightness. She is using rescue inhaler 4-6 x per day.   using proaire respiclick/ severe hoarseness / did not activate "action plan"  rec Plan A = Automatic  = symbicort 80 Take 2 puffs first thing in am and then another 2 puffs about 12 hours later and continue spiriva for now as well               Pantoprazole (protonix) 40 mg   Take 30-60 min  before first meal of the day and prilosec 20 mg 30 min before supper Plan B  Only use your albutero(proair)  As rescue  Plan C = contingency> Prednisone 10 mg take  4 each am x 2 days,   2 each am x 2 days,  1 each am x 2 days and stop  GERD diet    11/25/2015  f/u ov/Kerri Johnson re: asthma on qvar 80 2 q pm / primary is shoenoff  Chief Complaint  Patient presents with  . Follow-up    Breathing is doing well. She rarely uses albuterol. No new co's today/  Not limited by breathing from desired activities   rec Plan A = Automatic =  Qvar 80 Take 2 puffs first thing in am and then another 2 puffs about 12 hours later.  Plan B = Backup Only use your albuterol as a rescue medication Plan C = Crisis - only use your albuterol nebulizer if you first try Plan B    11/28/2017  f/u ov/Kerri Johnson re: chronic asthma Chief Complaint  Patient presents with  . Follow-up    Breathing has been doing well. She is using her qvar 2 puffs at night. She has not had to use her albuterol.   Dyspnea:  Not limited  by breathing from desired activities  But  By R ankle pain / "cyst being injected"  Cough: none Sleeping: R side/ 2 pillows  SABA use: none  02: none   rec At the slightest suggestion of a flare of respiratory symptoms from any cause rec increase qvar to Take 2 puffs first thing in am and then another 2 puffs about 12 hours later.     09/07/2018  f/u ov/Kerri Johnson re: chronic asthma  Chief Complaint  Patient presents with  . Follow-up    Breathing is "great" and she rarely uses rescue inhaler.    Dyspnea:  Not limited by breathing from desired activities   Cough: none Sleeping: ok  SABA use: on specific exp 02: none rec Qvar 40 2 pff each pm > for any flare increase to 4 pffs every 12 hours for a week then back down  Only use your albuterol as a rescue medication Please schedule a follow up visit in 6  months but call sooner if needed   Virtual Visit via Telephone Note 05/08/2019  Asthma with covid exp    I connected with Kerri Johnson on 05/08/19 at  9:45 AM EST by telephone and verified that I am speaking with the correct person using two identifiers.   I discussed the limitations, risks, security and privacy concerns of performing an evaluation and management service by telephone and the availability of in person appointments. I also discussed with the patient that there may be a patient responsible charge related to this service. The patient expressed understanding and agreed to proceed.   History of Present Illness: Dyspnea:  Not limited by breathing from desired activities   Cough: none Sleeping: no resp symptoms SABA use: none  02: none    No obvious day to day or daytime variability or assoc excess/ purulent sputum or mucus plugs or hemoptysis or cp or chest tightness, subjective wheeze or overt sinus or hb symptoms.    Also denies any obvious fluctuation of symptoms with weather or environmental changes or other aggravating or alleviating factors except as outlined above.   Meds reviewed/ med reconciliation completed      Observations/Objective: Sounds great on phone, no conversational sob/ good voice texture, no cough    Assessment and Plan: See problem list for active a/p's   Follow Up Instructions: See avs for instructions unique to this ov which includes revised/ updated med list     I discussed the assessment and treatment plan with the patient. The patient was provided an opportunity to ask questions and all were answered. The patient agreed with the plan and demonstrated an understanding of the instructions.   The patient was advised to call back or seek an in-person evaluation if the symptoms worsen or if the condition fails to improve as anticipated.  I provided 15 minutes of non-face-to-face time during this encounter.   Sandrea Hughs, MD

## 2019-05-10 ENCOUNTER — Encounter: Payer: Self-pay | Admitting: Internal Medicine

## 2019-05-10 DIAGNOSIS — J029 Acute pharyngitis, unspecified: Secondary | ICD-10-CM | POA: Diagnosis not present

## 2019-05-10 NOTE — Assessment & Plan Note (Signed)
Onset as child in [place] Follow by Dr [name], [place], until established in pulmonary clinic 05/10/14  - started on symbicort 80 2bid 06/26/14 > stopped  07/07/14   - PFTs  06/28/14 done during acute flare  FEV1  1.80 (73%) ratio 54 p 38% from bronchodilators and  Near nl dlco - 08/16/2014   try qvar 80 2bid as could not tolerate laba effects - 09/13/2014 p extensive coaching HFA effectiveness =    90%  - spirometry 09/13/2014   FEV1  1.72 (72%) ratio 63 s any bronchodilator  - Spirometry 11/25/2015  FEV1 1.49 (62%)  Ratio 58  s any bronchodilators  - Spirometry 11/28/2017  FEV1 1.5 (69%)  Ratio 74 though did not blow out 6 sec and does show curvature p qvar hs only    All goals of chronic asthma control met including optimal function and elimination of symptoms with minimal need for rescue therapy.  Contingencies discussed in full including contacting this office immediately if not controlling the symptoms using the rule of two's.   Advised on max dosing for qvar in event of flare and f/u can be q 6 m, sooner if needed   Each maintenance medication was reviewed in detail including most importantly the difference between maintenance and as needed and under what circumstances the prns are to be used.  Please see AVS for specific  Instructions which are unique to this visit and I personally typed out  which were reviewed in detail over the phone with the patient and a copy provided via mail.      

## 2019-06-13 ENCOUNTER — Telehealth: Payer: Self-pay | Admitting: Internal Medicine

## 2019-06-13 DIAGNOSIS — J4520 Mild intermittent asthma, uncomplicated: Secondary | ICD-10-CM | POA: Diagnosis not present

## 2019-06-13 NOTE — Telephone Encounter (Signed)
PFT report has been faxed to the number provided. Nothing further was needed.

## 2019-06-13 NOTE — Telephone Encounter (Signed)
Called and spoke to pt. Pt states it is ok for [place] to give [facility] her PFT results from 03/2019.  Left VM for Tammy to ask what the physician's first and last name is so we can send results via Epic.

## 2019-06-13 NOTE — Telephone Encounter (Signed)
Tammy called back and PFT results need to be sent to Dr. Lorenda Ishihara @ [facility].  Fax:  [phone]

## 2019-07-03 ENCOUNTER — Telehealth: Payer: Self-pay | Admitting: Licensed Clinical Social Worker

## 2019-07-03 ENCOUNTER — Encounter: Payer: Self-pay | Admitting: Cardiology

## 2019-07-03 ENCOUNTER — Ambulatory Visit: Payer: BC Managed Care – PPO | Admitting: Cardiology

## 2019-07-03 ENCOUNTER — Other Ambulatory Visit: Payer: Self-pay

## 2019-07-03 VITALS — BP 148/96 | HR 81 | Ht 67.0 in | Wt 225.1 lb

## 2019-07-03 DIAGNOSIS — R42 Dizziness and giddiness: Secondary | ICD-10-CM | POA: Diagnosis not present

## 2019-07-03 DIAGNOSIS — I10 Essential (primary) hypertension: Secondary | ICD-10-CM

## 2019-07-03 DIAGNOSIS — R002 Palpitations: Secondary | ICD-10-CM | POA: Diagnosis not present

## 2019-07-03 NOTE — Progress Notes (Signed)
Cardiology Office Note:    Date:  07/03/2019   ID:  Kerri Johnson, DOB 01/29/1956, MRN [id number]  PCP:  Lanice Shirts, MD  Cardiologist:  No primary care provider on file.  Electrophysiologist:  None   Referring MD: Lanice Shirts, *   Chief complaint: Vertigo  History of Present Illness:    Kerri Johnson is a 64 y.o. female with a hx of palpitations and dizziness. I evaluated her on 01/03/17 for this. Pt is a non smoker. Previously resided in [place] before moving to [place] in 2015. She had a cardiac cath while living in [place] and was told she had no blockages. She was referred to Dr. Mare Ferrari in the fall of 2015 for evaluation of chest pain. He ordered a NST. This was done 12/2013 and was negative for ischemia. EF normal. Study was read by Dr. Meda Coffee. Pt lost to f/u. I evaluated her on 01/03/17 for palpitations. She was set up with a 30 day monitor and 2D echo. Echo was normal. EF normal. No valve abnormalities. 30 day monitor showed PACs and occasional sinus tach but no other arrhythmias.  She was last seen in 2019 when she stated she discontinue metoprolol as she experienced significant fatigue.  07/03/2019 -the patient is coming after 2 years, she has been doing great, she remains very active and asymptomatic.  She gets occasional palpitations mostly strong beats when she goes to bed and occasional skipped beats.  No presyncope or syncope.  She does not usually check her blood pressure but when she goes to Dr. Gabriel Carina it is controlled.  She has been experiencing vertigo after she lays down in bed when the room is spinning around her.  No falls.  No orthostatic hypotension.   Past Medical History:  Diagnosis Date  . Asthma   . Colon polyps   . GERD (gastroesophageal reflux disease)   . Hypertension   . Thyroid nodule    dx 2009   Past Surgical History:  Procedure Laterality Date  . ABDOMINAL HYSTERECTOMY    . CESAREAN SECTION    . THYROIDECTOMY, PARTIAL       urrent Medications: Current Meds  Medication Sig  . acetaminophen (TYLENOL) 325 MG tablet Take 650 mg by mouth every 6 (six) hours as needed for moderate pain or headache.   . albuterol (VENTOLIN HFA) 108 (90 Base) MCG/ACT inhaler ONHALE 2 PUFFS BY MOUTH EVERY 4 HOURS AS NEEDED FOR FOR TROUBLE BREATHING  . beclomethasone (QVAR REDIHALER) 40 MCG/ACT inhaler Inhale 2 puffs into the lungs 2 (two) times daily.  . Cholecalciferol (VITAMIN D3 PO) Take 1 capsule by mouth daily.    Allergies:   Iodine, Shellfish allergy, and Versed [midazolam]   Social History   Socioeconomic History  . Marital status: Married    Spouse name: Not on file  . Number of children: 3  . Years of education: Not on file  . Highest education level: Not on file  Occupational History  . Not on file  Tobacco Use  . Smoking status: Never Smoker  . Smokeless tobacco: Never Used  Substance and Sexual Activity  . Alcohol use: No  . Drug use: No  . Sexual activity: Not on file  Other Topics Concern  . Not on file  Social History Narrative  . Not on file   Social Determinants of Health   Financial Resource Strain:   . Difficulty of Paying Living Expenses:   Food Insecurity:   . Worried About Running  Out of Food in the Last Year:   . Ran Out of Food in the Last Year:   Transportation Needs:   . Lack of Transportation (Medical):   Marland Kitchen Lack of Transportation (Non-Medical):   Physical Activity:   . Days of Exercise per Week:   . Minutes of Exercise per Session:   Stress:   . Feeling of Stress :   Social Connections:   . Frequency of Communication with Friends and Family:   . Frequency of Social Gatherings with Friends and Family:   . Attends Religious Services:   . Active Member of Clubs or Organizations:   . Attends Banker Meetings:   Marland Kitchen Marital Status:      Family History: The patient's family history includes Diabetes in her mother, sister, and sister; Heart disease in her mother;  Hypertension in her mother and sister.  ROS:   Please see the history of present illness.     All other systems reviewed and are negative.  EKGs/Labs/Other Studies Reviewed:    The following studies were reviewed today:   EKG:  EKG is ordered today.  The ekg ordered today demonstrates sinus rhythm, 69 bpm, normal EKG, unchanged from prior.  This was personally reviewed.  Recent Labs: No results found for requested labs within last 8760 hours.  Recent Lipid Panel    Component Value Date/Time   CHOL 150 11/23/2013 1012   TRIG 62 11/23/2013 1012   HDL 44 11/23/2013 1012   CHOLHDL 3.4 11/23/2013 1012   VLDL 12 11/23/2013 1012   LDLCALC 94 11/23/2013 1012    Physical Exam:    VS:  BP (!) 148/96   Pulse 81   Ht 5\' 7"  (1.702 m)   Wt 225 lb 1.9 oz (102.1 kg)   SpO2 95%   BMI 35.26 kg/m     Wt Readings from Last 3 Encounters:  07/03/19 225 lb 1.9 oz (102.1 kg)  09/07/18 217 lb 12.8 oz (98.8 kg)  11/28/17 229 lb (103.9 kg)     GEN:  Well nourished, well developed in no acute distress HEENT: Normal NECK: No JVD; No carotid bruits LYMPHATICS: No lymphadenopathy CARDIAC: RRR, no murmurs, rubs, gallops RESPIRATORY:  Clear to auscultation without rales, wheezing or rhonchi  ABDOMEN: Soft, non-tender, non-distended MUSCULOSKELETAL:  No edema; No deformity  SKIN: Warm and dry NEUROLOGIC:  Alert and oriented x 3 PSYCHIATRIC:  Normal affect   ASSESSMENT:    1. Hypertension, unspecified type   2. Vertigo   3. Palpitations    PLAN:    In order of problems listed above:  1. Palpitations/PACs: noted on recent heart monitor. Also occasional sinus tach. Improved symptoms with hydration and limiting caffeine intake. 2018 echo showed normal LVEF and no valve abnormalities.  This is overall improved.  She was intolerant to metoprolol.  2.  Benign positional vertigo. -We will print vestibular exercises for her.  3.  Hypertension -might be whitecoat syndrome, will obtain free  blood pressure cuff and have patient send 2019 some measurements.  She had no LVH on EKG or on prior echo.   Medication Adjustments/Labs and Tests Ordered: Current medicines are reviewed at length with the patient today.  Concerns regarding medicines are outlined above.  Orders Placed This Encounter  Procedures  . EKG 12-Lead   No orders of the defined types were placed in this encounter.   There are no Patient Instructions on file for this visit.   Signed, [place], MD  07/03/2019 9:29  AM    [place]

## 2019-07-03 NOTE — Telephone Encounter (Signed)
CSW referred to assist patient with obtaining a BP cuff. CSW contacted patient to inform cuff will be delivered to home. Patient grateful for support and assistance. CSW available as needed. Jackie Natasha Paulson, LCSW, CCSW-MCS [phone]  

## 2019-07-03 NOTE — Patient Instructions (Signed)
Medication Instructions:   Your physician recommends that you continue on your current medications as directed. Please refer to the Current Medication list given to you today.  *If you need a refill on your cardiac medications before your next appointment, please call your pharmacy*   Follow-Up: At [facility], you and your health needs are our priority.  As part of our continuing mission to provide you with exceptional heart care, we have created designated Provider Care Teams.  These Care Teams include your primary Cardiologist (physician) and Advanced Practice Providers (APPs -  Physician Assistants and Nurse Practitioners) who all work together to provide you with the care you need, when you need it.  We recommend signing up for the patient portal called "MyChart".  Sign up information is provided on this After Visit Summary.  MyChart is used to connect with patients for Virtual Visits (Telemedicine).  Patients are able to view lab/test results, encounter notes, upcoming appointments, etc.  Non-urgent messages can be sent to your provider as well.   To learn more about what you can do with MyChart, go to [email or web address].    Your next appointment:   12 month(s)  The format for your next appointment:   In Person  Provider:   Tobias Alexander, MD   Other Instructions  WE HAVE SENT A MESSAGE TO OUR SOCIAL WORKER TO COORDINATE MAILING YOU A BLOOD PRESSURE CUFF TO YOUR HOME.    WE HAVE PRINTED OFF SOME RESOURCES AND EXERCISES FOR YOU TO DO, CALLED [facility], TO HELP WITH YOUR VERTIGO

## 2019-07-20 ENCOUNTER — Observation Stay (HOSPITAL_COMMUNITY): Payer: BC Managed Care – PPO

## 2019-07-20 ENCOUNTER — Telehealth: Payer: Self-pay | Admitting: Cardiology

## 2019-07-20 ENCOUNTER — Other Ambulatory Visit: Payer: Self-pay

## 2019-07-20 ENCOUNTER — Encounter (HOSPITAL_COMMUNITY): Payer: Self-pay | Admitting: Pediatrics

## 2019-07-20 ENCOUNTER — Observation Stay (HOSPITAL_COMMUNITY)
Admission: EM | Admit: 2019-07-20 | Discharge: 2019-07-22 | Disposition: A | Discharge status: Home / Self Care | Payer: BC Managed Care – PPO | Attending: Internal Medicine | Admitting: Internal Medicine

## 2019-07-20 ENCOUNTER — Emergency Department (HOSPITAL_COMMUNITY): Payer: BC Managed Care – PPO

## 2019-07-20 ENCOUNTER — Inpatient Hospital Stay (HOSPITAL_COMMUNITY): Payer: BC Managed Care – PPO

## 2019-07-20 DIAGNOSIS — E669 Obesity, unspecified: Secondary | ICD-10-CM

## 2019-07-20 DIAGNOSIS — Z7982 Long term (current) use of aspirin: Secondary | ICD-10-CM | POA: Insufficient documentation

## 2019-07-20 DIAGNOSIS — Z7951 Long term (current) use of inhaled steroids: Secondary | ICD-10-CM | POA: Diagnosis not present

## 2019-07-20 DIAGNOSIS — Z888 Allergy status to other drugs, medicaments and biological substances status: Secondary | ICD-10-CM | POA: Diagnosis not present

## 2019-07-20 DIAGNOSIS — Z6834 Body mass index (BMI) 34.0-34.9, adult: Secondary | ICD-10-CM | POA: Diagnosis not present

## 2019-07-20 DIAGNOSIS — Z20822 Contact with and (suspected) exposure to covid-19: Secondary | ICD-10-CM | POA: Insufficient documentation

## 2019-07-20 DIAGNOSIS — G43909 Migraine, unspecified, not intractable, without status migrainosus: Secondary | ICD-10-CM | POA: Insufficient documentation

## 2019-07-20 DIAGNOSIS — R42 Dizziness and giddiness: Secondary | ICD-10-CM | POA: Diagnosis not present

## 2019-07-20 DIAGNOSIS — Z79899 Other long term (current) drug therapy: Secondary | ICD-10-CM | POA: Diagnosis not present

## 2019-07-20 DIAGNOSIS — K219 Gastro-esophageal reflux disease without esophagitis: Secondary | ICD-10-CM | POA: Diagnosis not present

## 2019-07-20 DIAGNOSIS — I10 Essential (primary) hypertension: Secondary | ICD-10-CM | POA: Diagnosis present

## 2019-07-20 DIAGNOSIS — I6782 Cerebral ischemia: Secondary | ICD-10-CM | POA: Insufficient documentation

## 2019-07-20 DIAGNOSIS — I639 Cerebral infarction, unspecified: Secondary | ICD-10-CM | POA: Diagnosis not present

## 2019-07-20 DIAGNOSIS — Z7902 Long term (current) use of antithrombotics/antiplatelets: Secondary | ICD-10-CM | POA: Diagnosis not present

## 2019-07-20 DIAGNOSIS — R29898 Other symptoms and signs involving the musculoskeletal system: Secondary | ICD-10-CM

## 2019-07-20 DIAGNOSIS — R202 Paresthesia of skin: Secondary | ICD-10-CM | POA: Diagnosis not present

## 2019-07-20 DIAGNOSIS — G4489 Other headache syndrome: Secondary | ICD-10-CM | POA: Diagnosis not present

## 2019-07-20 DIAGNOSIS — E785 Hyperlipidemia, unspecified: Secondary | ICD-10-CM | POA: Diagnosis not present

## 2019-07-20 DIAGNOSIS — G459 Transient cerebral ischemic attack, unspecified: Secondary | ICD-10-CM | POA: Diagnosis not present

## 2019-07-20 DIAGNOSIS — J4540 Moderate persistent asthma, uncomplicated: Secondary | ICD-10-CM | POA: Diagnosis present

## 2019-07-20 DIAGNOSIS — R29810 Facial weakness: Secondary | ICD-10-CM

## 2019-07-20 DIAGNOSIS — G43809 Other migraine, not intractable, without status migrainosus: Secondary | ICD-10-CM

## 2019-07-20 DIAGNOSIS — H8110 Benign paroxysmal vertigo, unspecified ear: Secondary | ICD-10-CM | POA: Insufficient documentation

## 2019-07-20 DIAGNOSIS — Z8673 Personal history of transient ischemic attack (TIA), and cerebral infarction without residual deficits: Secondary | ICD-10-CM | POA: Diagnosis not present

## 2019-07-20 DIAGNOSIS — R29818 Other symptoms and signs involving the nervous system: Secondary | ICD-10-CM | POA: Diagnosis not present

## 2019-07-20 DIAGNOSIS — R519 Headache, unspecified: Secondary | ICD-10-CM | POA: Diagnosis not present

## 2019-07-20 DIAGNOSIS — R531 Weakness: Secondary | ICD-10-CM | POA: Diagnosis not present

## 2019-07-20 LAB — CBC
HCT: 42.5 % (ref 36.0–46.0)
Hemoglobin: 13.1 g/dL (ref 12.0–15.0)
MCH: 28.5 pg (ref 26.0–34.0)
MCHC: 30.8 g/dL (ref 30.0–36.0)
MCV: 92.6 fL (ref 80.0–100.0)
Platelets: 244 10*3/uL (ref 150–400)
RBC: 4.59 MIL/uL (ref 3.87–5.11)
RDW: 12.9 % (ref 11.5–15.5)
WBC: 4.4 10*3/uL (ref 4.0–10.5)
nRBC: 0 % (ref 0.0–0.2)

## 2019-07-20 LAB — URINALYSIS, ROUTINE W REFLEX MICROSCOPIC
Bacteria, UA: NONE SEEN
Bilirubin Urine: NEGATIVE
Glucose, UA: NEGATIVE mg/dL
Hgb urine dipstick: NEGATIVE
Ketones, ur: NEGATIVE mg/dL
Nitrite: NEGATIVE
Protein, ur: NEGATIVE mg/dL
Specific Gravity, Urine: 1.006 (ref 1.005–1.030)
pH: 8 (ref 5.0–8.0)

## 2019-07-20 LAB — COMPREHENSIVE METABOLIC PANEL
ALT: 33 U/L (ref 0–44)
AST: 30 U/L (ref 15–41)
Albumin: 3.8 g/dL (ref 3.5–5.0)
Alkaline Phosphatase: 75 U/L (ref 38–126)
Anion gap: 8 (ref 5–15)
BUN: 12 mg/dL (ref 8–23)
CO2: 26 mmol/L (ref 22–32)
Calcium: 9.6 mg/dL (ref 8.9–10.3)
Chloride: 105 mmol/L (ref 98–111)
Creatinine, Ser: 0.83 mg/dL (ref 0.44–1.00)
GFR calc Af Amer: >60 mL/min (ref >60)
GFR calc non Af Amer: >60 mL/min (ref >60)
Glucose, Bld: 99 mg/dL (ref 70–99)
Potassium: 4.2 mmol/L (ref 3.5–5.1)
Sodium: 139 mmol/L (ref 135–145)
Total Bilirubin: 0.9 mg/dL (ref 0.3–1.2)
Total Protein: 7 g/dL (ref 6.5–8.1)

## 2019-07-20 LAB — I-STAT CHEM 8, ED
BUN: 13 mg/dL (ref 8–23)
Calcium, Ion: 1.22 mmol/L (ref 1.15–1.40)
Chloride: 105 mmol/L (ref 98–111)
Creatinine, Ser: 0.7 mg/dL (ref 0.44–1.00)
Glucose, Bld: 96 mg/dL (ref 70–99)
HCT: 41 % (ref 36.0–46.0)
Hemoglobin: 13.9 g/dL (ref 12.0–15.0)
Potassium: 4.1 mmol/L (ref 3.5–5.1)
Sodium: 140 mmol/L (ref 135–145)
TCO2: 29 mmol/L (ref 22–32)

## 2019-07-20 LAB — APTT: aPTT: 32 seconds (ref 24–36)

## 2019-07-20 LAB — RAPID URINE DRUG SCREEN, HOSP PERFORMED
Amphetamines: NOT DETECTED
Barbiturates: NOT DETECTED
Benzodiazepines: NOT DETECTED
Cocaine: NOT DETECTED
Opiates: NOT DETECTED
Tetrahydrocannabinol: NOT DETECTED

## 2019-07-20 LAB — PROTIME-INR
INR: 1.1 (ref 0.8–1.2)
Prothrombin Time: 14 seconds (ref 11.4–15.2)

## 2019-07-20 LAB — DIFFERENTIAL
Abs Immature Granulocytes: 0.01 10*3/uL (ref 0.00–0.07)
Basophils Absolute: 0 10*3/uL (ref 0.0–0.1)
Basophils Relative: 1 %
Eosinophils Absolute: 0.1 10*3/uL (ref 0.0–0.5)
Eosinophils Relative: 1 %
Immature Granulocytes: 0 %
Lymphocytes Relative: 46 %
Lymphs Abs: 2.1 10*3/uL (ref 0.7–4.0)
Monocytes Absolute: 0.4 10*3/uL (ref 0.1–1.0)
Monocytes Relative: 10 %
Neutro Abs: 1.9 10*3/uL (ref 1.7–7.7)
Neutrophils Relative %: 42 %

## 2019-07-20 LAB — CBG MONITORING, ED: Glucose-Capillary: 89 mg/dL (ref 70–99)

## 2019-07-20 LAB — HIV ANTIBODY (ROUTINE TESTING W REFLEX): HIV Screen 4th Generation wRfx: NONREACTIVE

## 2019-07-20 LAB — SARS CORONAVIRUS 2 BY RT PCR (HOSPITAL ORDER, PERFORMED IN ~~LOC~~ HOSPITAL LAB): SARS Coronavirus 2: NEGATIVE

## 2019-07-20 LAB — ETHANOL: Alcohol, Ethyl (B): <10 mg/dL (ref <10)

## 2019-07-20 MED ORDER — ACETAMINOPHEN 650 MG RE SUPP: 650.0000 mg | RECTAL | Status: DC | PRN

## 2019-07-20 MED ORDER — SODIUM CHLORIDE 0.9 % IV SOLN: INTRAVENOUS | Status: DC

## 2019-07-20 MED ORDER — ACETAMINOPHEN 325 MG PO TABS: 650.0000 mg | ORAL_TABLET | ORAL | Status: DC | PRN

## 2019-07-20 MED ORDER — STROKE: EARLY STAGES OF RECOVERY BOOK
Freq: Once | Status: AC
  Filled 2019-07-20: qty 1

## 2019-07-20 MED ORDER — ENOXAPARIN SODIUM 40 MG/0.4ML ~~LOC~~ SOLN
40.0000 mg | SUBCUTANEOUS | Status: DC
  Administered 2019-07-20 – 2019-07-21 (×2): 40 mg via SUBCUTANEOUS
  Filled 2019-07-20 (×2): qty 0.4

## 2019-07-20 MED ORDER — CLOPIDOGREL BISULFATE 75 MG PO TABS
300.0000 mg | ORAL_TABLET | Freq: Once | ORAL | Status: AC
  Administered 2019-07-20: 300 mg via ORAL
  Filled 2019-07-20: qty 4

## 2019-07-20 MED ORDER — BUDESONIDE 0.25 MG/2ML IN SUSP
0.2500 mg | Freq: Two times a day (BID) | RESPIRATORY_TRACT | Status: DC
  Administered 2019-07-21 – 2019-07-22 (×2): 0.25 mg via RESPIRATORY_TRACT
  Filled 2019-07-20 (×3): qty 2

## 2019-07-20 MED ORDER — ACETAMINOPHEN 160 MG/5ML PO SOLN: 650.0000 mg | ORAL | Status: DC | PRN

## 2019-07-20 MED ORDER — CLOPIDOGREL BISULFATE 75 MG PO TABS
75.0000 mg | ORAL_TABLET | Freq: Every day | ORAL | Status: DC
  Administered 2019-07-21 – 2019-07-22 (×2): 75 mg via ORAL
  Filled 2019-07-20 (×2): qty 1

## 2019-07-20 MED ORDER — SENNOSIDES-DOCUSATE SODIUM 8.6-50 MG PO TABS: 1.0000 | ORAL_TABLET | Freq: Every evening | ORAL | Status: DC | PRN

## 2019-07-20 MED ORDER — ASPIRIN 325 MG PO TABS
325.0000 mg | ORAL_TABLET | Freq: Every day | ORAL | Status: DC
  Administered 2019-07-20: 325 mg via ORAL
  Filled 2019-07-20: qty 1

## 2019-07-20 MED ORDER — ACETAMINOPHEN 325 MG PO TABS
650.0000 mg | ORAL_TABLET | Freq: Once | ORAL | Status: AC
  Administered 2019-07-20: 650 mg via ORAL
  Filled 2019-07-20: qty 2

## 2019-07-20 MED ORDER — ASPIRIN 300 MG RE SUPP: 300.0000 mg | Freq: Every day | RECTAL | Status: DC

## 2019-07-20 NOTE — ED Provider Notes (Signed)
[facility] EMERGENCY DEPARTMENT Provider Note   CSN: [id number] Arrival date & time: 07/20/19  1101     History Chief Complaint  Patient presents with  . Headache  . Facial Droop    Kerri Johnson is a 64 y.o. female.  The history is provided by the patient and medical records.  Headache Pain location:  R temporal and R parietal Quality:  Dull Radiates to:  Does not radiate Severity currently:  4/10 Severity at highest:  6/10 Onset quality:  Gradual Duration:  1 day Timing:  Constant Progression:  Improving Chronicity:  New Context: not exposure to bright light   Relieved by:  Nothing Worsened by:  Nothing Associated symptoms: fatigue, focal weakness and numbness   Associated symptoms: no abdominal pain, no back pain, no blurred vision, no congestion, no cough, no diarrhea, no dizziness, no facial pain, no fever, no loss of balance, no nausea, no near-syncope, no neck pain, no neck stiffness, no paresthesias, no photophobia, no seizures, no sinus pressure, no visual change, no vomiting and no weakness        Past Medical History:  Diagnosis Date  . Asthma   . Colon polyps   . GERD (gastroesophageal reflux disease)   . Hypertension   . Thyroid nodule    dx 2009    Patient Active Problem List   Diagnosis Date Noted  . Obesity (BMI 30-39.9) 11/30/2015  . Thyroid nodules 09/15/2015  . Neck fullness 09/15/2015  . Moderate persistent allergic asthma without complication 05/10/2014  . Chest pain of uncertain etiology 11/28/2013  . Personal history of colonic polyps 11/16/2013  . GERD (gastroesophageal reflux disease) 11/16/2013  . HTN (hypertension) 11/16/2013    Past Surgical History:  Procedure Laterality Date  . ABDOMINAL HYSTERECTOMY    . CESAREAN SECTION    . THYROIDECTOMY, PARTIAL       OB History   No obstetric history on file.     Family History  Problem Relation Age of Onset  . Diabetes Mother   . Heart disease  Mother   . Hypertension Mother   . Diabetes Sister   . Hypertension Sister   . Diabetes Sister     Social History   Tobacco Use  . Smoking status: Never Smoker  . Smokeless tobacco: Never Used  Substance Use Topics  . Alcohol use: No  . Drug use: No    Home Medications Prior to Admission medications   Medication Sig Start Date End Date Taking? Authorizing Provider  acetaminophen (TYLENOL) 325 MG tablet Take 650 mg by mouth every 6 (six) hours as needed for moderate pain or headache.     [provider]  albuterol (VENTOLIN HFA) 108 (90 Base) MCG/ACT inhaler ONHALE 2 PUFFS BY MOUTH EVERY 4 HOURS AS NEEDED FOR FOR TROUBLE BREATHING 05/26/18   Nyoka Cowden, MD  beclomethasone (QVAR REDIHALER) 40 MCG/ACT inhaler Inhale 2 puffs into the lungs 2 (two) times daily. 09/07/18   Nyoka Cowden, MD  Cholecalciferol (VITAMIN D3 PO) Take 1 capsule by mouth daily.    [provider]    Allergies    Iodine, Shellfish allergy, and Versed [midazolam]  Review of Systems   Review of Systems  Constitutional: Positive for fatigue. Negative for chills, diaphoresis and fever.  HENT: Negative for congestion and sinus pressure.   Eyes: Negative for blurred vision and photophobia.  Respiratory: Negative for cough, chest tightness, shortness of breath, wheezing and stridor.   Cardiovascular: Negative for chest  pain, palpitations and near-syncope.  Gastrointestinal: Negative for abdominal pain, diarrhea, nausea and vomiting.  Genitourinary: Negative for dysuria.  Musculoskeletal: Negative for back pain, neck pain and neck stiffness.  Neurological: Positive for focal weakness, facial asymmetry, speech difficulty (dysarthria), numbness and headaches. Negative for dizziness, seizures, weakness, light-headedness, paresthesias and loss of balance.  Psychiatric/Behavioral: Negative for agitation and confusion.  All other systems reviewed and are negative.   Physical Exam Updated Vital  Signs BP (!) 156/85 (BP Location: Left Arm)   Pulse 71   Temp 98.4 F (36.9 C) (Oral)   Resp 18   Ht 5\' 7"  (1.702 m)   Wt 100.7 kg   SpO2 98%   BMI 34.77 kg/m   Physical Exam Vitals and nursing note reviewed.  Constitutional:      General: She is not in acute distress.    Appearance: She is well-developed. She is not ill-appearing, toxic-appearing or diaphoretic.  HENT:     Head: Normocephalic and atraumatic.     Mouth/Throat:     Mouth: Mucous membranes are moist.     Pharynx: Oropharynx is clear.  Eyes:     Extraocular Movements: Extraocular movements intact.     Conjunctiva/sclera: Conjunctivae normal.     Pupils: Pupils are equal, round, and reactive to light. Pupils are equal.  Cardiovascular:     Rate and Rhythm: Normal rate and regular rhythm.     Heart sounds: No murmur.  Pulmonary:     Effort: Pulmonary effort is normal. No respiratory distress.     Breath sounds: Normal breath sounds.  Abdominal:     Palpations: Abdomen is soft.     Tenderness: There is no abdominal tenderness.  Musculoskeletal:        General: No swelling or tenderness.     Cervical back: Neck supple.  Skin:    General: Skin is warm and dry.     Capillary Refill: Capillary refill takes less than 2 seconds.  Neurological:     Mental Status: She is alert and oriented to person, place, and time.     GCS: GCS eye subscore is 4. GCS verbal subscore is 5. GCS motor subscore is 6.     Cranial Nerves: Dysarthria and facial asymmetry present.     Sensory: Sensory deficit present.     Motor: Weakness present.     Comments: Grip strength decreased on right compared to left.  Right lower facial droop.  Normal extraocular movements.  Tingling/numbness in the right face and right arm.  Normal leg exam.  Abdomen nontender.  Chest nontender.  Lungs clear.  Psychiatric:        Mood and Affect: Mood is anxious.        Behavior: Behavior is not agitated.     ED Results / Procedures / Treatments   Labs  (all labs ordered are listed, but only abnormal results are displayed) Labs Reviewed  URINALYSIS, ROUTINE W REFLEX MICROSCOPIC - Abnormal; Notable for the following components:      Result Value   Color, Urine STRAW (*)    Leukocytes,Ua TRACE (*)    All other components within normal limits  SARS CORONAVIRUS 2 BY RT PCR (HOSPITAL ORDER, PERFORMED IN [facility])  ETHANOL  PROTIME-INR  APTT  CBC  DIFFERENTIAL  COMPREHENSIVE METABOLIC PANEL  RAPID URINE DRUG SCREEN, HOSP PERFORMED  CBG MONITORING, ED  I-STAT CHEM 8, ED    EKG EKG Interpretation  Date/Time:  Friday Jul 20 2019 11:06:24 EDT Ventricular  Rate:  67 PR Interval:    QRS Duration: 87 QT Interval:  375 QTC Calculation: 396 R Axis:   52 Text Interpretation: Sinus rhythm Anteroseptal infarct, old when compared to prior, no significant cahnges seen. No STEMI Confirmed by Antony Blackbird [phone]) on 07/20/2019 11:12:40 AM   Radiology CT Head Wo Contrast  Result Date: 07/20/2019 CLINICAL DATA:  Headache EXAM: CT HEAD WITHOUT CONTRAST TECHNIQUE: Contiguous axial images were obtained from the base of the skull through the vertex without intravenous contrast. COMPARISON:  12/10/2016 FINDINGS: Brain: Chronic microvascular disease throughout the deep white matter. No acute intracranial abnormality. Specifically, no hemorrhage, hydrocephalus, mass lesion, acute infarction, or significant intracranial injury. Vascular: No hyperdense vessel or unexpected calcification. Skull: No acute calvarial abnormality. Sinuses/Orbits: Visualized paranasal sinuses and mastoids clear. Orbital soft tissues unremarkable. Other: None IMPRESSION: Chronic small vessel disease throughout the deep white matter. No acute intracranial abnormality. Electronically Signed   By: Rolm Baptise M.D.   On: 07/20/2019 14:15    Procedures Procedures (including critical care time)  Medications Ordered in ED Medications  acetaminophen (TYLENOL) tablet  650 mg (650 mg Oral Given 07/20/19 1327)    ED Course  I have reviewed the triage vital signs and the nursing notes.  Pertinent labs & imaging results that were available during my care of the patient were reviewed by me and considered in my medical decision making (see chart for details).    MDM Rules/Calculators/A&P                      Wave Calzada is a 64 y.o. female with a past medical history significant for hypertension, GERD, asthma, and verbal report of prior TIA who presents with headache, right-sided facial weakness/numbness, and right arm numbness/weakness.  Patient reports that her symptoms began this morning after she woke up.  She does report that at 10 PM last night, she had a headache after watching TV and going to bed but did not notice any neurologic deficits at that time.  Her last normal was at 10 PM when her headache began.  She reports the pain is primarily in her right head and is moderate.  It is not as bad as it was last night.  There was no neck involvement.  She denies any preceding fevers, chills, congestion, cough, nausea, vomiting, urinary symptoms, or GI symptoms.  She denies any trauma.  She denies any double vision or blurry vision.  This morning, she reports that she was having change in the sound of her voice due to her right mouth not cooperating and sounding dysarthric.  She denied aphasia or a problem with getting her words out.  She reports the symptoms are more severe than when she had prior TIA but in a similar distribution.  She denies any leg symptoms or other complaints.  Denies any dizziness at this time.  EMS reports her glucose was in the 90s and vital signs were reassuring in transport aside from blood pressure in the 170s.  EKG shows no STEMI.  On my exam, patient does have weakness in her right grip strength impaired left.  Also right-sided facial droop which spared her forehead.  She was mildly dysarthric but I did not appreciate any  aphasia as she was saying everything she needed to.  Pupils are symmetric and reactive with normal extraocular movements.  Lungs were clear and chest was nontender.  Abdomen was nontender.  No lower extremity tenderness or weakness.  No numbness.  Good pulses in extremities.  Patient was Zenaida Niece negative given her lack of visual changes, dizziness, neglect, or aphasia.  Patient will have a stroke work-up but we will not call code stroke at this time as her last normal was 10 PM last night.  Anticipate follow-up on the work-up results and imaging.     CT scan shows some chronic microvascular changes but no acute stroke seen.  As okay with neurology who feel she likely stroke.  They recommend MRI and admission for further stroke management.  She will be admitted.   Final Clinical Impression(s) / ED Diagnoses Final diagnoses:  Facial droop  Right arm weakness     Clinical Impression: 1. Facial droop   2. Right arm weakness     Disposition: Admit  This note was prepared with assistance of Dragon voice recognition software. Occasional wrong-word or sound-a-like substitutions may have occurred due to the inherent limitations of voice recognition software.     Tegeler, Canary Brim, MD 07/20/19 1446

## 2019-07-20 NOTE — Consult Note (Signed)
Neurology Consultation Reason for Consult: Right-sided weakness Referring Physician: Tegeler, C  CC: Right-sided weakness  History is obtained from: Patient  HPI: Kerri Johnson is a 64 y.o. female he was in her normal state of health yesterday when she went to bed, this morning on awakening she had some right-sided weakness.  She states that it is been persistent since awakening.  She was not LVO positive and therefore no code stroke was activated.  She also has some numbness on the right side.  She denies visual change.   LKW: 5/13 prior to bed tpa given?: no, outside window    ROS: A 14 point ROS was performed and is negative except as noted in the HPI.   Past Medical History:  Diagnosis Date  . Asthma   . Colon polyps   . GERD (gastroesophageal reflux disease)   . Hypertension   . Thyroid nodule    dx 2009     Family History  Problem Relation Age of Onset  . Diabetes Mother   . Heart disease Mother   . Hypertension Mother   . Diabetes Sister   . Hypertension Sister   . Diabetes Sister      Social History:  reports that she has never smoked. She has never used smokeless tobacco. She reports that she does not drink alcohol or use drugs.   Exam: Current vital signs: BP (!) 147/82   Pulse 67   Temp 98.4 F (36.9 C) (Oral)   Resp 15   Ht 5\' 7"  (1.702 m)   Wt 100.7 kg   SpO2 100%   BMI 34.77 kg/m  Vital signs in last 24 hours: Temp:  [98.4 F (36.9 C)] 98.4 F (36.9 C) (05/14 1105) Pulse Rate:  [57-71] 67 (05/14 1500) Resp:  [10-20] 15 (05/14 1500) BP: (126-161)/(73-96) 147/82 (05/14 1500) SpO2:  [96 %-100 %] 100 % (05/14 1500) Weight:  [100.7 kg] 100.7 kg (05/14 1107)   Physical Exam  Constitutional: Appears well-developed and well-nourished.  Psych: Affect appropriate to situation Eyes: No scleral injection HENT: No OP obstrucion MSK: no joint deformities.  Cardiovascular: Normal rate and regular rhythm.  Respiratory: Effort normal,  non-labored breathing GI: Soft.  No distension. There is no tenderness.  Skin: WDI  Neuro: Mental Status: Patient is awake, alert, oriented to person, place, month, year, and situation. Patient is able to give a clear and coherent history. No signs of aphasia or neglect Cranial Nerves: II: Visual Fields are full. Pupils are equal, round, and reactive to light.   III,IV, VI: EOMI without ptosis or diploplia.  V: Facial sensation diminished on the right VII: Facial movement with right facial weakness VIII: hearing is intact to voice X: Uvula elevates symmetrically XI: Shoulder shrug is symmetric. XII: tongue is midline without atrophy or fasciculations.  Motor: Tone is normal. Bulk is normal.  She has 4/5 weakness of the right arm and leg sensory: Sensation is diminished in the right arm but intact in the right leg Cerebellar: She has difficulty consistent with weakness on the right      I have reviewed labs in epic and the results pertinent to this consultation are: CMP-unremarkable  I have reviewed the images obtained: CT head-unremarkable  Impression: 64 year old female with acute right-sided weakness was present on awakening.  I strongly suspect that she has had a small subcortical infarct and she will need to be admitted for secondary risk factor modification and physical therapy.  Recommendations: - HgbA1c, fasting lipid panel -  MRI, MRA  of the brain without contrast - Frequent neuro checks - Echocardiogram - Carotid dopplers - Prophylactic therapy-aspirin 81 mg daily, Plavix 75 mg daily following 300 mg load - Risk factor modification - Telemetry monitoring - PT consult, OT consult, Speech consult - Stroke team to follow    Ritta Slot, MD [facility] [phone]  If 7pm- 7am, please page neurology on call as listed in [facility]ION.

## 2019-07-20 NOTE — ED Triage Notes (Signed)
Patient arrived via EMS; concern for potential headache. Stated started last night at 10 PM; and upon waking up this morning headache is still there along w/ facial droop and right sided weakness and numbness as well. A&0x4.

## 2019-07-20 NOTE — ED Notes (Signed)
Patient in MRI 

## 2019-07-20 NOTE — H&P (Addendum)
History and Physical    [name] VQQ:[id number] DOB: [birth year] DOA: 07/20/2019  Referring MD/NP/PA: Dala Dock, MD PCP: Lanice Shirts, MD  Patient coming from: Home   Chief Complaint: Headache and right facial numbness and weakness  I have personally briefly reviewed patient's old medical records in [place]   HPI: Kerri Johnson is a 64 y.o. right-handed female with medical history significant of hypertension, thyroid nodule, asthma, GERD,migraine headaches,  and TIA presents with complaints headache with right facial numbness and weakness.  Last night around 10 PM she started to have a right-sided headache, but did not notice any deficits until this morning at around 8 AM.  She felt that the right side of her face was tingling including her lips and part of her upper shoulder.  Noted associated symptoms of drooping of the right side of her mouth and feeling that she was talking funny.  Noted similar symptoms with prior TIA.  Denied having any changes in her vision, chest pain, nausea, vomiting, abdominal pain, dysuria, diarrhea, loss of consciousness, or recent falls.  She notes associated symptoms of having intermittent palpitations for which she is under the care of cardiology, but they have not formally diagnosed her with any, arrhythmia.  She normally gets migraine headaches from time to time with aura, but denied having any aura with the headache last night.  Furthermore she has a history of vertigo and notes that here lately whenever going to sleep at night feels like the room is spinning around her.  She called her doctor's office who told her to call 911.  EMS noted patient's blood pressure to be into the 170s and initial blood glucose was within normal limits.  ED Course: Upon admission into the patient was noted to be afebrile, pulse 57-71, blood pressure 143/84-161/92, and all other vital signs maintained.  Labs are unremarkable.  UDS  negative.  COVID-19 screening was negative.  Patient was not a candidate for TPA.  Neurology had been formally consulted and recommending getting MRI and completion of stroke work-up.  Urinalysis did not show any acute signs of infection.  Review of Systems  Constitutional: Negative for chills and fever.  HENT: Negative for congestion and sinus pain.   Eyes: Negative for double vision and photophobia.  Respiratory: Negative for cough and shortness of breath.   Cardiovascular: Positive for palpitations. Negative for chest pain and leg swelling.  Gastrointestinal: Negative for nausea and vomiting.  Genitourinary: Negative for dysuria and hematuria.  Musculoskeletal: Negative for falls and myalgias.  Skin: Negative for itching and rash.  Neurological: Positive for dizziness, tingling, sensory change, focal weakness and headaches. Negative for loss of consciousness.  Endo/Heme/Allergies: Negative for polydipsia.  Psychiatric/Behavioral: Negative for memory loss and substance abuse.    Past Medical History:  Diagnosis Date  . Asthma   . Colon polyps   . GERD (gastroesophageal reflux disease)   . Hypertension   . Thyroid nodule    dx 2009    Past Surgical History:  Procedure Laterality Date  . ABDOMINAL HYSTERECTOMY    . CESAREAN SECTION    . THYROIDECTOMY, PARTIAL       reports that she has never smoked. She has never used smokeless tobacco. She reports that she does not drink alcohol or use drugs.  Allergies  Allergen Reactions  . Iodine Anaphylaxis  . Shellfish Allergy Anaphylaxis  . Versed [Midazolam] Other (See Comments)    Effects memory    Family History  Problem  Relation Age of Onset  . Diabetes Mother   . Heart disease Mother   . Hypertension Mother   . Diabetes Sister   . Hypertension Sister   . Diabetes Sister     Prior to Admission medications   Medication Sig Start Date End Date Taking? Authorizing Provider  acetaminophen (TYLENOL) 325 MG tablet Take 650  mg by mouth every 6 (six) hours as needed for moderate pain or headache.     [provider]  albuterol (VENTOLIN HFA) 108 (90 Base) MCG/ACT inhaler ONHALE 2 PUFFS BY MOUTH EVERY 4 HOURS AS NEEDED FOR FOR TROUBLE BREATHING 05/26/18   Nyoka Cowden, MD  beclomethasone (QVAR REDIHALER) 40 MCG/ACT inhaler Inhale 2 puffs into the lungs 2 (two) times daily. 09/07/18   Nyoka Cowden, MD  Cholecalciferol (VITAMIN D3 PO) Take 1 capsule by mouth daily.    [provider]    Physical Exam:  Constitutional: Older female who appears to be in no acute distress at this time. Vitals:   07/20/19 1215 07/20/19 1230 07/20/19 1245 07/20/19 1300  BP: (!) 161/92 (!) 145/86 (!) 143/84 (!) 149/80  Pulse: 69 (!) 57 64 64  Resp: 20 10 20 14   Temp:      TempSrc:      SpO2: 100% 99% 100% 99%  Weight:      Height:       Eyes: PERRL, lids and conjunctivae normal ENMT: Mucous membranes are moist. Posterior pharynx clear of any exudate or lesions.  Neck: normal, supple, no masses, no thyromegaly Respiratory: clear to auscultation bilaterally, no wheezing, no crackles. Normal respiratory effort. No accessory muscle use.  Cardiovascular: Regular rate and rhythm, no murmurs / rubs / gallops. No extremity edema. 2+ pedal pulses. No carotid bruits.  Abdomen: no tenderness, no masses palpated. No hepatosplenomegaly. Bowel sounds positive.  Musculoskeletal: no clubbing / cyanosis. No joint deformity upper and lower extremities. Good ROM, no contractures. Normal muscle tone.  Skin: no rashes, lesions, ulcers. No induration Neurologic: CN 2-12 grossly intact. DTR normal. Strength 5/5 on left upper and lower extremity.  Strength 4+/5 on the right upper extremity and 5/5 on the right lower extremity.  Right-sided facial droop present.  Decreased sensation reported on the right side of face. Psychiatric: Normal judgment and insight. Alert and oriented x 3. Normal mood.     Labs on Admission: I have  personally reviewed following labs and imaging studies  CBC: Recent Labs  Lab 07/20/19 1107 07/20/19 1119  WBC 4.4  --   NEUTROABS 1.9  --   HGB 13.1 13.9  HCT 42.5 41.0  MCV 92.6  --   PLT 244  --    Basic Metabolic Panel: Recent Labs  Lab 07/20/19 1107 07/20/19 1119  NA 139 140  K 4.2 4.1  CL 105 105  CO2 26  --   GLUCOSE 99 96  BUN 12 13  CREATININE 0.83 0.70  CALCIUM 9.6  --    GFR: Estimated Creatinine Clearance: 90 mL/min (by C-G formula based on SCr of 0.7 mg/dL). Liver Function Tests: Recent Labs  Lab 07/20/19 1107  AST 30  ALT 33  ALKPHOS 75  BILITOT 0.9  PROT 7.0  ALBUMIN 3.8   No results for input(s): LIPASE, AMYLASE in the last 168 hours. No results for input(s): AMMONIA in the last 168 hours. Coagulation Profile: Recent Labs  Lab 07/20/19 1107  INR 1.1   Cardiac Enzymes: No results for input(s): CKTOTAL, CKMB, CKMBINDEX, TROPONINI in  the last 168 hours. BNP (last 3 results) No results for input(s): PROBNP in the last 8760 hours. HbA1C: No results for input(s): HGBA1C in the last 72 hours. CBG: Recent Labs  Lab 07/20/19 1102  GLUCAP 89   Lipid Profile: No results for input(s): CHOL, HDL, LDLCALC, TRIG, CHOLHDL, LDLDIRECT in the last 72 hours. Thyroid Function Tests: No results for input(s): TSH, T4TOTAL, FREET4, T3FREE, THYROIDAB in the last 72 hours. Anemia Panel: No results for input(s): VITAMINB12, FOLATE, FERRITIN, TIBC, IRON, RETICCTPCT in the last 72 hours. Urine analysis:    Component Value Date/Time   COLORURINE STRAW (A) 07/20/2019 1220   APPEARANCEUR CLEAR 07/20/2019 1220   LABSPEC 1.006 07/20/2019 1220   PHURINE 8.0 07/20/2019 1220   GLUCOSEU NEGATIVE 07/20/2019 1220   HGBUR NEGATIVE 07/20/2019 1220   BILIRUBINUR NEGATIVE 07/20/2019 1220   BILIRUBINUR neg 09/02/2014 1410   KETONESUR NEGATIVE 07/20/2019 1220   PROTEINUR NEGATIVE 07/20/2019 1220   UROBILINOGEN 0.2 09/02/2014 1410   NITRITE NEGATIVE 07/20/2019 1220     LEUKOCYTESUR TRACE (A) 07/20/2019 1220   Sepsis Labs: Recent Results (from the past 240 hour(s))  SARS Coronavirus 2 by RT PCR (hospital order, performed in [facility] hospital lab) Nasopharyngeal Nasopharyngeal Swab     Status: None   Collection Time: 07/20/19  1:05 PM   Specimen: Nasopharyngeal Swab  Result Value Ref Range Status   SARS Coronavirus 2 NEGATIVE NEGATIVE Final    Comment: (NOTE) SARS-CoV-2 target nucleic acids are NOT DETECTED. The SARS-CoV-2 RNA is generally detectable in upper and lower respiratory specimens during the acute phase of infection. The lowest concentration of SARS-CoV-2 viral copies this assay can detect is 250 copies / mL. A negative result does not preclude SARS-CoV-2 infection and should not be used as the sole basis for treatment or other patient management decisions.  A negative result may occur with improper specimen collection / handling, submission of specimen other than nasopharyngeal swab, presence of viral mutation(s) within the areas targeted by this assay, and inadequate number of viral copies (<250 copies / mL). A negative result must be combined with clinical observations, patient history, and epidemiological information. Fact Sheet for Patients:   BoilerBrush.com.cy Fact Sheet for Healthcare Providers: https://pope.com/ This test is not yet approved or cleared  by the [place] FDA and has been authorized for detection and/or diagnosis of SARS-CoV-2 by FDA under an Emergency Use Authorization (EUA).  This EUA will remain in effect (meaning this test can be used) for the duration of the COVID-19 declaration under Section 564(b)(1) of the Act, 21 U.S.C. section 360bbb-3(b)(1), unless the authorization is terminated or revoked sooner. Performed at [facility], [address] [place], [place] [address]      Radiological Exams on Admission: CT Head Wo Contrast  Result  Date: 07/20/2019 CLINICAL DATA:  Headache EXAM: CT HEAD WITHOUT CONTRAST TECHNIQUE: Contiguous axial images were obtained from the base of the skull through the vertex without intravenous contrast. COMPARISON:  12/10/2016 FINDINGS: Brain: Chronic microvascular disease throughout the deep white matter. No acute intracranial abnormality. Specifically, no hemorrhage, hydrocephalus, mass lesion, acute infarction, or significant intracranial injury. Vascular: No hyperdense vessel or unexpected calcification. Skull: No acute calvarial abnormality. Sinuses/Orbits: Visualized paranasal sinuses and mastoids clear. Orbital soft tissues unremarkable. Other: None IMPRESSION: Chronic small vessel disease throughout the deep white matter. No acute intracranial abnormality. Electronically Signed   By: Charlett Nose M.D.   On: 07/20/2019 14:15    EKG: Independently reviewed.  Sinus rhythm  at 67 bpm  Assessment/Plan TIA: Acute.  Patient presents with right-sided facial numbness and tingling after having headache the night before.  CT scan of the brain negative for any acute abnormalities.  Patient was not a TPA candidate, but reported improvement in symptoms since being here at the hospital.  MRI of the brain was negative, but patient still having symptoms.  Differential includes TIA versus a complex migraine possibly. -Admit to telemetry bed -Stroke order set initiated -Neuro checks -Check MRA of the head without contrast -Check carotid Dopplers -PT/OT/Speech to eval and treat -Check echocardiogram -Check Hemoglobin A1c and lipid panel in a.m. -ASA and Plavix per neurology -Appreciate neurology consultative services,  will follow-up for any further recommendations  Essential hypertension: Initial blood pressures elevated up to 161/92.  She is not on any blood pressure medications at home.  Would allow for permissive hypertension in the acute setting. -Continue to monitor   Migraine headaches -Tylenol as needed  for headache  Asthma: Stable.  Patient without wheezing noted on physical exam -Albuterol nebs as needed for shortness of breath/wheeze -Pharmacy substitution of budesonide nebs for Qvar  Palpitations: Patient still reports having palpitations.  Records note patient had Holter monitoring back in 2018 which showed no arrhythmias. -Follow-up telemetry overnight  Vertigo: Patient complains of feeling dizzy as though the room is spinning around her whenever laying down at night. -PT consulted  Obesity: BMI 34.77 kg/m  DVT prophylaxis: Lovenox Code Status: Full Family Communication: Husband updated at bedside Disposition Plan: Possible discharge home in a.m. Consults called: Neurology Admission status: Observation  Clydie Braun MD [facility] Pager [phone]   If 7PM-7AM, please contact night-coverage [email or web address] Password [facility]  07/20/2019, 2:53 PM

## 2019-07-20 NOTE — Telephone Encounter (Signed)
Called the pt and she is reporting that she went to bed last night and was "shivering".. she covered up and went top sleep. She woke up in the middle of the night and was having tingling on the right side only of her face. She went back to sleep and this morning she is still having the tingling on the right side of her face and it feels weak and difficult to .Marland KitchenMarland KitchenBut her speech sounds clear. Pt says the top of her head is tender to touch although she does not have a headache.   Pt says she had a TIA back in 2003.  Her last several BP readings:  What are your last 5 BP readings?  137/94 131/92 138/93 142/98 151/76 152/87 155/98 149/97  Pt is very concerned about her symptoms as am I and she agreed that she should call EMS and go to the ED for assessment.

## 2019-07-20 NOTE — Telephone Encounter (Signed)
Pt c/o BP issue: STAT if pt c/o blurred vision, one-sided weakness or slurred speech  1. What are your last 5 BP readings?  137/94 131/92 138/93 142/98 151/76 152/87 155/98 149/97  2. Are you having any other symptoms (ex. Dizziness, headache, blurred vision, passed out)? Pain in the top R side of her head and tingling in her face   3. What is your BP issue? Pt said the pain in her head and the tingling makes her nervous. The feeling started last night and she thought it would go away with rest but she woke up and it was back.

## 2019-07-21 ENCOUNTER — Observation Stay (HOSPITAL_BASED_OUTPATIENT_CLINIC_OR_DEPARTMENT_OTHER): Payer: BC Managed Care – PPO

## 2019-07-21 ENCOUNTER — Other Ambulatory Visit (HOSPITAL_COMMUNITY): Payer: BC Managed Care – PPO

## 2019-07-21 DIAGNOSIS — G459 Transient cerebral ischemic attack, unspecified: Secondary | ICD-10-CM

## 2019-07-21 DIAGNOSIS — I6389 Other cerebral infarction: Secondary | ICD-10-CM

## 2019-07-21 DIAGNOSIS — I10 Essential (primary) hypertension: Secondary | ICD-10-CM | POA: Diagnosis not present

## 2019-07-21 LAB — LIPID PANEL
Cholesterol: 179 mg/dL (ref 0–200)
HDL: 61 mg/dL (ref >40)
LDL Cholesterol: 103 mg/dL — ABNORMAL HIGH (ref 0–99)
Total CHOL/HDL Ratio: 2.9 RATIO
Triglycerides: 76 mg/dL (ref <150)
VLDL: 15 mg/dL (ref 0–40)

## 2019-07-21 LAB — BASIC METABOLIC PANEL
Anion gap: 8 (ref 5–15)
BUN: 10 mg/dL (ref 8–23)
CO2: 26 mmol/L (ref 22–32)
Calcium: 9.2 mg/dL (ref 8.9–10.3)
Chloride: 105 mmol/L (ref 98–111)
Creatinine, Ser: 0.7 mg/dL (ref 0.44–1.00)
GFR calc Af Amer: >60 mL/min (ref >60)
GFR calc non Af Amer: >60 mL/min (ref >60)
Glucose, Bld: 104 mg/dL — ABNORMAL HIGH (ref 70–99)
Potassium: 3.9 mmol/L (ref 3.5–5.1)
Sodium: 139 mmol/L (ref 135–145)

## 2019-07-21 LAB — CBC
HCT: 40.5 % (ref 36.0–46.0)
Hemoglobin: 12.9 g/dL (ref 12.0–15.0)
MCH: 29 pg (ref 26.0–34.0)
MCHC: 31.9 g/dL (ref 30.0–36.0)
MCV: 91 fL (ref 80.0–100.0)
Platelets: 218 10*3/uL (ref 150–400)
RBC: 4.45 MIL/uL (ref 3.87–5.11)
RDW: 12.9 % (ref 11.5–15.5)
WBC: 3.7 10*3/uL — ABNORMAL LOW (ref 4.0–10.5)
nRBC: 0 % (ref 0.0–0.2)

## 2019-07-21 LAB — ECHOCARDIOGRAM COMPLETE
Height: 67 in
Weight: 3552 oz

## 2019-07-21 LAB — HEMOGLOBIN A1C
Hgb A1c MFr Bld: 5.7 % — ABNORMAL HIGH (ref 4.8–5.6)
Mean Plasma Glucose: 116.89 mg/dL

## 2019-07-21 MED ORDER — ATORVASTATIN CALCIUM 40 MG PO TABS
40.0000 mg | ORAL_TABLET | Freq: Every day | ORAL | Status: DC
  Administered 2019-07-21 – 2019-07-22 (×2): 40 mg via ORAL
  Filled 2019-07-21 (×2): qty 1

## 2019-07-21 MED ORDER — ASPIRIN EC 81 MG PO TBEC
81.0000 mg | DELAYED_RELEASE_TABLET | Freq: Every day | ORAL | Status: DC
  Administered 2019-07-21 – 2019-07-22 (×2): 81 mg via ORAL
  Filled 2019-07-21 (×2): qty 1

## 2019-07-21 NOTE — Progress Notes (Signed)
  Echocardiogram 2D Echocardiogram has been performed.  Gerda Diss 07/21/2019, 10:45 AM

## 2019-07-21 NOTE — Progress Notes (Signed)
PROGRESS NOTE  Kerri Johnson UJW:[id number] DOB: September 06, 1955 DOA: 07/20/2019 PCP: Lanice Shirts, MD  HPI/Recap of past 24 hours: Kerri Johnson is a 64 y.o. right-handed female with medical history significant of hypertension, thyroid nodule, asthma, GERD,migraine headaches,  and TIA presents with complaints headache with right facial numbness and weakness.  Last night around 10 PM she started to have a right-sided headache, but did not notice any deficits until this morning at around 8 AM.  She felt that the right side of her face was tingling including her lips and part of her upper shoulder.  Noted associated symptoms of drooping of the right side of her mouth and feeling that she was talking funny.  Noted similar symptoms with prior TIA.  Denied having any changes in her vision, chest pain, nausea, vomiting, abdominal pain, dysuria, diarrhea, loss of consciousness, or recent falls.  She notes associated symptoms of having intermittent palpitations for which she is under the care of cardiology, but they have not formally diagnosed her with any, arrhythmia.  She normally gets migraine headaches from time to time with aura, but denied having any aura with the headache last night.  Furthermore she has a history of vertigo and notes that here lately whenever going to sleep at night feels like the room is spinning around her.  She called her doctor's office who told her to call 911.  EMS noted patient's blood pressure to be into the 170s and initial blood glucose was within normal limits.  ED Course: Upon admission into the patient was noted to be afebrile, pulse 57-71, blood pressure 143/84-161/92, and all other vital signs maintained.  Labs are unremarkable.  UDS negative.  COVID-19 screening was negative.  Patient was not a candidate for TPA.  Neurology had been formally consulted and recommending getting MRI and completion of stroke work-up.  Urinalysis did not show any acute signs  of infection.  07/21/19: Seen and examined.  Still has some numbness affecting the right side of her face.  Seen  And evaluated by PT and treated for BPPV, too dizzy and unsteady on her feet for dc.  Will have repeat treatment tomorrow by PT.    Assessment/Plan: Principal Problem:   TIA (transient ischemic attack) Active Problems:   HTN (hypertension)   Moderate persistent allergic asthma without complication   Obesity (BMI 30-39.9)   CVA (cerebral vascular accident) ([place])   TIA: Acute.   Patient presents with right-sided facial numbness and tingling after having headache the night before.  CT scan of the brain negative for any acute abnormalities.    MRI of the brain was negative.   Differential includes TIA versus a complex migraine possibly. -ASA and Plavix per neurology LDL 103, goal LDL less than 70 Hemoglobin A1c 5.7, goal less than 7.0, at goal. Continue aspirin, Plavix and statin, Lipitor 40 mg daily -Appreciate neurology consultative services,  will follow-up for any further recommendations  BPPV Managed by PT Plan to treat again tomorrow Fall precautions  Essential hypertension:  Initial blood pressures elevated up to 161/92.  She is not on any blood pressure medications at home.  Would allow for permissive hypertension in the acute setting. -Continue to monitor   Migraine headaches -Tylenol as needed for headache  Asthma:  Stable.  Patient without wheezing noted on physical exam -Albuterol nebs as needed for shortness of breath/wheeze -Pharmacy substitution of budesonide nebs for Qvar  Palpitations:  Patient reported having palpitations.  Records note patient had Holter  monitoring back in 2018 which showed no arrhythmias. -Follow-up telemetry overnight  Obesity: BMI 34.77 kg/m  Ambulatory dysfunction PT assessment recommending home health PT Continue PT OT with assistance and fall precautions  Asthma Stable O2 saturation 99% on room air Resume  bronchodilators    DVT prophylaxis: Lovenox sq daily Code Status: Full  Family Communication:None at bedside  Consults called: Neurology  Status is: Observation    Dispo: The patient is from: Home              Anticipated d/c is to: Home with home health services              Anticipated d/c date is:07/22/19 or when neurology signs off.                Patient currently unsafe for dc due too dizziness, unsteadiness.        Objective: Vitals:   07/21/19 0018 07/21/19 0211 07/21/19 0435 07/21/19 0745  BP: 133/86 131/80 127/83 (!) 146/56  Pulse: 70 83 78 70  Resp: Temp: 97.9 F (36.6 C) 97.9 F (36.6 C) 98.1 F (36.7 C) 98.2 F (36.8 C)  TempSrc: Oral Oral Oral Oral  SpO2: 98% 96% 95% 99%  Weight:      Height:        Intake/Output Summary (Last 24 hours) at 07/21/2019 1347 Last data filed at 07/21/2019 0551 Gross per 24 hour  Intake 952.43 ml  Output 200 ml  Net 752.43 ml   Filed Weights   07/20/19 1107  Weight: 100.7 kg    Exam:  . General: 64 y.o. year-old female well developed well nourished in no acute distress.  Alert and oriented x3. . Cardiovascular: Regular rate and rhythm with no rubs or gallops.  No thyromegaly or JVD noted.   Marland Kitchen Respiratory: Clear to auscultation with no wheezes or rales. Good inspiratory effort. . Abdomen: Soft nontender nondistended with normal bowel sounds x4 quadrants. . Musculoskeletal: No lower extremity edema. 2/4 pulses in all 4 extremities. Marland Kitchen Psychiatry: Mood is appropriate for condition and setting   Data Reviewed: CBC: Recent Labs  Lab 07/20/19 1107 07/20/19 1119 07/21/19 0445  WBC 4.4  --  3.7*  NEUTROABS 1.9  --   --   HGB 13.1 13.9 12.9  HCT 42.5 41.0 40.5  MCV 92.6  --  91.0  PLT 244  --  218   Basic Metabolic Panel: Recent Labs  Lab 07/20/19 1107 07/20/19 1119 07/21/19 0445  NA 139 140 139  K 4.2 4.1 3.9  CL 105 105 105  CO2 26  --  26  GLUCOSE 99 96 104*  BUN CREATININE 0.83 0.70 0.70  CALCIUM 9.6  --  9.2   GFR: Estimated Creatinine Clearance: 90 mL/min (by C-G formula based on SCr of 0.7 mg/dL). Liver Function Tests: Recent Labs  Lab 07/20/19 1107  AST 30  ALT 33  ALKPHOS 75  BILITOT 0.9  PROT 7.0  ALBUMIN 3.8   No results for input(s): LIPASE, AMYLASE in the last 168 hours. No results for input(s): AMMONIA in the last 168 hours. Coagulation Profile: Recent Labs  Lab 07/20/19 1107  INR 1.1   Cardiac Enzymes: No results for input(s): CKTOTAL, CKMB, CKMBINDEX, TROPONINI in the last 168 hours. BNP (last 3 results) No results for input(s): PROBNP in the last 8760 hours. HbA1C: Recent Labs    07/21/19 0445  HGBA1C 5.7*   CBG: Recent Labs  Lab 07/20/19 1102  GLUCAP 89   Lipid Profile: Recent Labs    07/21/19 0445  CHOL 179  HDL 61  LDLCALC 103*  TRIG 76  CHOLHDL 2.9   Thyroid Function Tests: No results for input(s): TSH, T4TOTAL, FREET4, T3FREE, THYROIDAB in the last 72 hours. Anemia Panel: No results for input(s): VITAMINB12, FOLATE, FERRITIN, TIBC, IRON, RETICCTPCT in the last 72 hours. Urine analysis:    Component Value Date/Time   COLORURINE STRAW (A) 07/20/2019 1220   APPEARANCEUR CLEAR 07/20/2019 1220   LABSPEC 1.006 07/20/2019 1220   PHURINE 8.0 07/20/2019 1220   GLUCOSEU NEGATIVE 07/20/2019 1220   HGBUR NEGATIVE 07/20/2019 1220   BILIRUBINUR NEGATIVE 07/20/2019 1220   BILIRUBINUR neg 09/02/2014 1410   KETONESUR NEGATIVE 07/20/2019 1220   PROTEINUR NEGATIVE 07/20/2019 1220   UROBILINOGEN 0.2 09/02/2014 1410   NITRITE NEGATIVE 07/20/2019 1220   LEUKOCYTESUR TRACE (A) 07/20/2019 1220   Sepsis Labs: @LABRCNTIP (procalcitonin:4,lacticidven:4)  ) Recent Results (from the past 240 hour(s))  SARS Coronavirus 2 by RT PCR (hospital order, performed in [facility] hospital lab) Nasopharyngeal Nasopharyngeal Swab     Status: None   Collection Time: 07/20/19  1:05 PM   Specimen: Nasopharyngeal Swab   Result Value Ref Range Status   SARS Coronavirus 2 NEGATIVE NEGATIVE Final    Comment: (NOTE) SARS-CoV-2 target nucleic acids are NOT DETECTED. The SARS-CoV-2 RNA is generally detectable in upper and lower respiratory specimens during the acute phase of infection. The lowest concentration of SARS-CoV-2 viral copies this assay can detect is 250 copies / mL. A negative result does not preclude SARS-CoV-2 infection and should not be used as the sole basis for treatment or other patient management decisions.  A negative result may occur with improper specimen collection / handling, submission of specimen other than nasopharyngeal swab, presence of viral mutation(s) within the areas targeted by this assay, and inadequate number of viral copies (<250 copies / mL). A negative result must be combined with clinical observations, patient history, and epidemiological information. Fact Sheet for Patients:   07/22/19 Fact Sheet for Healthcare Providers: BoilerBrush.com.cy This test is not yet approved or cleared  by the https://pope.com/ FDA and has been authorized for detection and/or diagnosis of SARS-CoV-2 by FDA under an Emergency Use Authorization (EUA).  This EUA will remain in effect (meaning this test can be used) for the duration of the COVID-19 declaration under Section 564(b)(1) of the Act, 21 U.S.C. section 360bbb-3(b)(1), unless the authorization is terminated or revoked sooner. Performed at [facility], [address], [place], [place]       Studies: DG Chest 2 View  Result Date: 07/20/2019 CLINICAL DATA:  CVA EXAM: CHEST - 2 VIEW COMPARISON:  None. FINDINGS: The heart size and mediastinal contours are within normal limits. Both lungs are clear. The visualized skeletal structures are unremarkable. IMPRESSION: No active cardiopulmonary disease. Electronically Signed   By: 07/22/2019 M.D.   On: 07/20/2019 15:47    CT Head Wo Contrast  Result Date: 07/20/2019 CLINICAL DATA:  Headache EXAM: CT HEAD WITHOUT CONTRAST TECHNIQUE: Contiguous axial images were obtained from the base of the skull through the vertex without intravenous contrast. COMPARISON:  12/10/2016 FINDINGS: Brain: Chronic microvascular disease throughout the deep white matter. No acute intracranial abnormality. Specifically, no hemorrhage, hydrocephalus, mass lesion, acute infarction, or significant intracranial injury. Vascular: No hyperdense vessel or unexpected calcification. Skull: No acute calvarial abnormality. Sinuses/Orbits: Visualized paranasal sinuses and mastoids clear. Orbital soft tissues unremarkable. Other:  None IMPRESSION: Chronic small vessel disease throughout the deep white matter. No acute intracranial abnormality. Electronically Signed   By: Charlett NoseKevin  Dover M.D.   On: 07/20/2019 14:15   MR ANGIO HEAD WO CONTRAST  Result Date: 07/20/2019 CLINICAL DATA:  Stroke follow-up EXAM: MRA HEAD WITHOUT CONTRAST TECHNIQUE: Angiographic images of the Circle of Willis were obtained using MRA technique without intravenous contrast. COMPARISON:  Brain MRI 07/20/2019 FINDINGS: POSTERIOR CIRCULATION: --Vertebral arteries: Left vertebral artery terminates in PICA. Normal right V4 segment. --Inferior cerebellar arteries: Normal. --Basilar artery: Normal. --Superior cerebellar arteries: Normal. --Posterior cerebral arteries: Normal. Both are predominantly supplied by the posterior communicating arteries (p-comm). ANTERIOR CIRCULATION: --Intracranial internal carotid arteries: Normal. --Anterior cerebral arteries (ACA): Normal. Both A1 segments are present. Patent anterior communicating artery (a-comm). --Middle cerebral arteries (MCA): Normal. IMPRESSION: Normal intracranial MRA. Electronically Signed   By: Deatra RobinsonKevin  Herman M.D.   On: 07/20/2019 23:29   MR BRAIN WO CONTRAST  Result Date: 07/20/2019 CLINICAL DATA:  Neuro deficit, acute EXAM: MRI HEAD  WITHOUT CONTRAST TECHNIQUE: Multiplanar, multiecho pulse sequences of the brain and surrounding structures were obtained without intravenous contrast. COMPARISON:  CT head 07/20/2019 FINDINGS: Brain: Ventricle size normal. Cerebral volume normal. Prominent perivascular spaces in the cerebral white matter bilaterally Negative for acute infarct, hemorrhage, mass. No significant chronic ischemia. Vascular: Normal arterial flow voids. Skull and upper cervical spine: Negative Sinuses/Orbits: Mild mucosal edema paranasal sinuses.  Normal orbit Other: None IMPRESSION: Negative MRI head. Prominent perivascular spaces. Electronically Signed   By: Marlan Palauharles  Clark M.D.   On: 07/20/2019 16:34   ECHOCARDIOGRAM COMPLETE  Result Date: 07/21/2019    ECHOCARDIOGRAM REPORT   Patient Name:   Astin Sophronia SimasHEODORA Konz Date of Exam: 07/21/2019 Medical Rec #:  [id number]             Height:       67.0 in Accession #:    [id number]            Weight:       222.0 lb Date of Birth:  1955/07/24             BSA:          2.114 m Patient Age:    63 years              BP:           146/56 mmHg Patient Gender: F                     HR:           70 bpm. Exam Location:  Inpatient Procedure: 2D Echo, Cardiac Doppler and Color Doppler Indications:    Stroke 434.91/I163.9  History:        Patient has prior history of Echocardiogram examinations, most                 recent 01/13/2017. Risk Factors:Hypertension. GERD.  Sonographer:    Ross LudwigArthur Guy RDCS (AE) Referring Phys: [id number] [facility]LL A SMITH  Sonographer Comments: Patient is morbidly obese and suboptimal parasternal window. Image acquisition challenging due to respiratory motion and Image acquisition challenging due to patient body habitus. IMPRESSIONS  1. Left ventricular ejection fraction, by estimation, is 60 to 65%. The left ventricle has normal function. The left ventricle has no regional wall motion abnormalities. Left ventricular diastolic parameters are indeterminate. Transverse false tendon  in  apical LV (normal variant).  2. Right ventricular systolic function is normal. The right ventricular size is  normal. Tricuspid regurgitation signal is inadequate for assessing PA pressure.  3. The mitral valve is grossly normal. Trivial mitral valve regurgitation.  4. The aortic valve was not well visualized. Aortic valve regurgitation is not visualized.  5. The inferior vena cava is normal in size with greater than 50% respiratory variability, suggesting right atrial pressure of 3 mmHg. FINDINGS  Left Ventricle: Left ventricular ejection fraction, by estimation, is 60 to 65%. The left ventricle has normal function. The left ventricle has no regional wall motion abnormalities. The left ventricular internal cavity size was normal in size. There is  borderline left ventricular hypertrophy. Left ventricular diastolic parameters are indeterminate. Right Ventricle: The right ventricular size is normal. No increase in right ventricular wall thickness. Right ventricular systolic function is normal. Tricuspid regurgitation signal is inadequate for assessing PA pressure. Left Atrium: Left atrial size was normal in size. Right Atrium: Right atrial size was normal in size. Pericardium: There is no evidence of pericardial effusion. Presence of pericardial fat pad. Mitral Valve: The mitral valve is grossly normal. Trivial mitral valve regurgitation. MV peak gradient, 2.4 mmHg. The mean mitral valve gradient is 1.0 mmHg. Tricuspid Valve: The tricuspid valve is grossly normal. Tricuspid valve regurgitation is trivial. Aortic Valve: The aortic valve was not well visualized. Aortic valve regurgitation is not visualized. Aortic valve mean gradient measures 3.0 mmHg. Aortic valve peak gradient measures 4.7 mmHg. Aortic valve area, by VTI measures 3.37 cm. Pulmonic Valve: The pulmonic valve was not well visualized. Pulmonic valve regurgitation is not visualized. Aorta: The aortic root is normal in size and structure. Venous: The  inferior vena cava is normal in size with greater than 50% respiratory variability, suggesting right atrial pressure of 3 mmHg. IAS/Shunts: No atrial level shunt detected by color flow Doppler.  LEFT VENTRICLE PLAX 2D LVIDd:         4.60 cm  Diastology LVIDs:         3.00 cm  LV e' lateral:   8.92 cm/s LV PW:         1.00 cm  LV E/e' lateral: 5.2 LV IVS:        1.00 cm  LV e' medial:    7.29 cm/s LVOT diam:     1.90 cm  LV E/e' medial:  6.3 LV SV:         67 LV SV Index:   32 LVOT Area:     2.84 cm  RIGHT VENTRICLE             IVC RV Basal diam:  2.90 cm     IVC diam: 1.10 cm RV S prime:     13.80 cm/s TAPSE (M-mode): 2.0 cm LEFT ATRIUM             Index       RIGHT ATRIUM           Index LA diam:        2.00 cm 0.95 cm/m  RA Area:     12.70 cm LA Vol (A2C):   34.2 ml 16.18 ml/m RA Volume:   28.10 ml  13.29 ml/m LA Vol (A4C):   20.2 ml 9.56 ml/m LA Biplane Vol: 27.5 ml 13.01 ml/m  AORTIC VALVE AV Area (Vmax):    2.49 cm AV Area (Vmean):   2.46 cm AV Area (VTI):     3.37 cm AV Vmax:           108.00 cm/s AV Vmean:  80.300 cm/s AV VTI:            0.200 m AV Peak Grad:      4.7 mmHg AV Mean Grad:      3.0 mmHg LVOT Vmax:         94.70 cm/s LVOT Vmean:        69.700 cm/s LVOT VTI:          0.238 m LVOT/AV VTI ratio: 1.19  AORTA Ao Root diam: 2.90 cm Ao Asc diam:  2.90 cm MITRAL VALVE MV Area (PHT): 1.96 cm    SHUNTS MV Peak grad:  2.4 mmHg    Systemic VTI:  0.24 m MV Mean grad:  1.0 mmHg    Systemic Diam: 1.90 cm MV Vmax:       0.78 m/s MV Vmean:      47.6 cm/s MV Decel Time: 387 msec MV E velocity: 46.00 cm/s MV A velocity: 65.60 cm/s MV E/A ratio:  0.70 Nona Dell MD Electronically signed by Nona Dell MD Signature Date/Time: 07/21/2019/12:35:35 PM    Final    VAS US CAROTID  Result Date: 07/21/2019 Carotid Arterial Duplex Study Indications:       TIA, Numbness, Weakness and Facial droop, headache. Risk Factors:      Hypertension. Comparison Study:  No prior study on file for comparison  Performing Technologist: Sherren Kerns RVS  Examination Guidelines: A complete evaluation includes B-mode imaging, spectral Doppler, color Doppler, and power Doppler as needed of all accessible portions of each vessel. Bilateral testing is considered an integral part of a complete examination. Limited examinations for reoccurring indications may be performed as noted.  Right Carotid Findings: +----------+--------+--------+--------+------------------+------------------+           PSV cm/sEDV cm/sStenosisPlaque DescriptionComments           +----------+--------+--------+--------+------------------+------------------+ CCA Prox  87      13                                intimal thickening +----------+--------+--------+--------+------------------+------------------+ CCA Distal106     33                                intimal thickening +----------+--------+--------+--------+------------------+------------------+ ICA Prox  96      32                                                   +----------+--------+--------+--------+------------------+------------------+ ICA Distal109     47                                                   +----------+--------+--------+--------+------------------+------------------+ ECA       81      12                                                   +----------+--------+--------+--------+------------------+------------------+ +----------+--------+-------+--------+-------------------+           PSV cm/sEDV cmsDescribeArm Pressure (mmHG) +----------+--------+-------+--------+-------------------+ [id number]                                         +----------+--------+-------+--------+-------------------+ +---------+--------+--+--------+--+  VertebralPSV cm/s75EDV cm/s22 +---------+--------+--+--------+--+  Left Carotid Findings: +----------+--------+--------+--------+------------------+------------------+           PSV cm/sEDV  cm/sStenosisPlaque DescriptionComments           +----------+--------+--------+--------+------------------+------------------+ CCA Prox  113     29                                intimal thickening +----------+--------+--------+--------+------------------+------------------+ CCA Distal113     33                                intimal thickening +----------+--------+--------+--------+------------------+------------------+ ICA Prox  80      25                                                   +----------+--------+--------+--------+------------------+------------------+ ICA Distal53      25                                                   +----------+--------+--------+--------+------------------+------------------+ ECA       89      13                                                   +----------+--------+--------+--------+------------------+------------------+ +----------+--------+--------+--------+-------------------+           PSV cm/sEDV cm/sDescribeArm Pressure (mmHG) +----------+--------+--------+--------+-------------------+ [id number]                                         +----------+--------+--------+--------+-------------------+ +---------+--------+--+--------+-+ VertebralPSV cm/s37EDV cm/s1 +---------+--------+--+--------+-+   Summary: Right Carotid: The extracranial vessels were near-normal with only minimal wall                thickening or plaque. Left Carotid: The extracranial vessels were near-normal with only minimal wall               thickening or plaque. Vertebrals:  Bilateral vertebral arteries demonstrate antegrade flow. Subclavians: Normal flow hemodynamics were seen in bilateral subclavian              arteries. *See table(s) above for measurements and observations.     Preliminary     Scheduled Meds: . aspirin EC  81 mg Oral Daily  . atorvastatin  40 mg Oral Daily  . budesonide (PULMICORT) nebulizer solution  0.25 mg  Nebulization BID  . clopidogrel  75 mg Oral Daily  . enoxaparin (LOVENOX) injection  40 mg Subcutaneous Q24H    Continuous Infusions: . sodium chloride 75 mL/hr at 07/21/19 0551     LOS: 1 day     Darlin Drop, MD [facility] Pager [phone]  If 7PM-7AM, please contact night-coverage [email or web address] Password [facility] 07/21/2019, 1:47 PM

## 2019-07-21 NOTE — Progress Notes (Signed)
Physical Therapy Treatment Patient Details Name: Kerri Johnson MRN: [id number] DOB: May 16, 1955 Today's Date: 07/21/2019    History of Present Illness 64 y.o. female with medical history significant of hypertension, thyroid nodule, asthma, GERD,migraine headaches,vertigo and TIA presented to hospital on 07/20/19 after awakened with right-sided weakness. Reports recently when she goes to bed at night the room is spinning. MRI brain negative for acute changes    PT Comments    Pt admitted with above symptoms. Patient with multiple medical issues effecting her safety with mobility and currently is a high fall risk. She had symptomatic +orthostatic BPs with legs weak/wobbly when standing. She also has left posterior canal BPPV (with Epley completed x 1). RN and MD made aware re: orthostasis. Patient lives with husband who works and reports she will not have 24/7 assist on discharge (however she has not asked either).  Pt currently with functional limitations due to the deficits listed below (see PT Problem List). Pt will benefit from skilled PT to increase their independence and safety with mobility to allow discharge to the venue listed below.      Follow Up Recommendations  Home health PT;Supervision/Assistance - 24 hour(pt reports family cannot provide 24/7, but she has not asked)     Equipment Recommendations  Rolling walker with 5" wheels    Recommendations for Other Services OT consult     Precautions / Restrictions Precautions Precautions: Fall           Vestibular Assessment   07/21/19 1341  Symptom Behavior  Subjective history of current problem for months has been having spinning sensation when she lies down in bed and when she sits up  Type of Dizziness  Spinning  Frequency of Dizziness daily  Duration of Dizziness 1 minute  Symptom Nature Positional  Aggravating Factors Lying supine;Supine to sit  Relieving Factors Head stationary  Progression of Symptoms No change  since onset  History of similar episodes none  Positional Testing  Dix-Hallpike Dix-Hallpike Right;Dix-Hallpike Left  Sidelying Test Sidelying Right  Horizontal Canal Testing Horizontal Canal Right;Horizontal Canal Left  Dix-Hallpike Right  Dix-Hallpike Right Duration while in position  Dix-Hallpike Right Symptoms Other (comment) (nausea, less than during left HP; rt eye sl medial drift)  Dix-Hallpike Left  Dix-Hallpike Left Duration with bed trendelenburg-while in position (<1 min); at end of bed 15 seconds  Dix-Hallpike Left Symptoms Other (comment);No nystagmus (+nausea (worse than rt HP); left eye drifts medially)  Sidelying Right  Sidelying Right Duration 0  Sidelying Right Symptoms No nystagmus  Horizontal Canal Right  Horizontal Canal Right Duration 0  Horizontal Canal Right Symptoms Normal  Horizontal Canal Left  Horizontal Canal Left Duration 0  Horizontal Canal Left Symptoms Normal  Cognition  Cognition Orientation Level Oriented x 4  Orthostatics  Orthostatics Comment see vitals flowsheet (+)   Vestibular Treatment        07/21/19 1348  Vestibular Treatment/Exercise  Vestibular Treatment Provided Canalith Repositioning  Canalith Repositioning Epley Manuever Left   EPLEY MANUEVER LEFT  Number of Reps  1  Overall Response   (unsure; unable to tolerate followup Dix-Hallpike to assess)   RESPONSE DETAILS LEFT No nystagmus seen (?eyes have learned to fixate over the months); +spinning sensation 1st 3 positions all <30 seconds   Mobility  Bed Mobility Overal bed mobility: Needs Assistance Bed Mobility: Rolling;Sidelying to Sit;Sit to Sidelying Rolling: Modified independent (Device/Increase time) Sidelying to sit: Min guard     Sit to sidelying: Min guard General bed mobility comments: +  vertigo/spinning with supine<>sit  Transfers Overall transfer level: Needs assistance Equipment used: Rolling walker (2 wheeled);None Transfers: Sit to/from [employer]  to Stand: Min assist Stand pivot transfers: Min assist       General transfer comment: pivot bed to [facility] no device with staggering LOB to her left required assist to regain balance; with RW legs weak and min assist (+orthostatic)  Ambulation/Gait Ambulation/Gait assistance: Min assist Gait Distance (Feet): 5 Feet(15) Assistive device: Rolling walker (2 wheeled) Gait Pattern/deviations: Step-to pattern;Decreased stride length;Staggering left;Wide base of support Gait velocity: slow, guarded   General Gait Details: pt assisted to sink to wash up after episode of purewick not working and bed soaked; had to sit due to lightheadedness; walked to recliner to recline with unsteady gait   Stairs             Wheelchair Mobility    Modified Rankin (Stroke Patients Only)       Balance Overall balance assessment: Needs assistance Sitting-balance support: No upper extremity supported;Feet supported Sitting balance-Leahy Scale: Fair     Standing balance support: Bilateral upper extremity supported Standing balance-Leahy Scale: Poor Standing balance comment: unsteady with dizziness                            Cognition Arousal/Alertness: Awake/alert Behavior During Therapy: Anxious Overall Cognitive Status: Within Functional Limits for tasks assessed                                        Exercises      General Comments        Pertinent Vitals/Pain Pain Assessment: No/denies pain    Home Living Family/patient expects to be discharged to:: Private residence Living Arrangements: Spouse/significant other Available Help at Discharge: Family;Available PRN/intermittently(husband works) Type of Home: House Home Access: Stairs to enter Entrance Stairs-Rails: None Home Layout: Able to live on main level with bedroom/bathroom;Two level(doesn't have to go upstairs) Home Equipment: Shower seat      Prior Function Level of  Independence: Independent      Comments: driving, completely independent   PT Goals (current goals can now be found in the care plan section) Acute Rehab PT Goals Patient Stated Goal: regain independence PT Goal Formulation: With patient Time For Goal Achievement: 08/04/19 Potential to Achieve Goals: Good    Frequency    Min 4X/week      PT Plan      Co-evaluation              AM-PAC PT "6 Clicks" Mobility   Outcome Measure  Help needed turning from your back to your side while in a flat bed without using bedrails?: None Help needed moving from lying on your back to sitting on the side of a flat bed without using bedrails?: A Little Help needed moving to and from a bed to a chair (including a wheelchair)?: A Little Help needed standing up from a chair using your arms (e.g., wheelchair or bedside chair)?: A Little Help needed to walk in hospital room?: A Lot Help needed climbing 3-5 steps with a railing? : Total 6 Click Score: 16    End of Session Equipment Utilized During Treatment: Gait belt Activity Tolerance: Treatment limited secondary to medical complications (Comment)(nausea) Patient left: in bed;with bed alarm set;with call bell/phone within reach(chair position) Nurse Communication: Mobility status;Other (comment)(BPPV; upright  for 1 hour) PT Visit Diagnosis: Unsteadiness on feet (R26.81);Other symptoms and signs involving the nervous system (R29.898);BPPV BPPV - Right/Left : Left     Time: 9432-7614 PT Time Calculation (min) (ACUTE ONLY): 86 min  Charges:  $Gait Training: 8-22 mins $Therapeutic Activity: 8-22 mins $Self Care/Home Management: 23-37 $Canalith Rep Proc: 8-22 mins                      Jerolyn Center, PT Pager [phone]    Zena Amos 07/21/2019, 1:37 PM

## 2019-07-21 NOTE — Progress Notes (Signed)
STROKE TEAM PROGRESS NOTE   INTERVAL HISTORY Her son is at the bedside.  Patient lying in bed, stated that she felt much better today, right-sided weakness and numbness largely resolved, however, still has right facial numbness and tingling sensation.   OBJECTIVE Vitals:   07/21/19 0018 07/21/19 0211 07/21/19 0435 07/21/19 0745  BP: 133/86 131/80 127/83 (!) 146/56  Pulse: 70 83 78 70  Resp: 18 19 18 20   Temp: 97.9 F (36.6 C) 97.9 F (36.6 C) 98.1 F (36.7 C) 98.2 F (36.8 C)  TempSrc: Oral Oral Oral Oral  SpO2: 98% 96% 95% 99%  Weight:      Height:        CBC:  Recent Labs  Lab 07/20/19 1107 07/20/19 1107 07/20/19 1119 07/21/19 0445  WBC 4.4  --   --  3.7*  NEUTROABS 1.9  --   --   --   HGB 13.1   < > 13.9 12.9  HCT 42.5   < > 41.0 40.5  MCV 92.6  --   --  91.0  PLT 244  --   --  218   < > = values in this interval not displayed.    Basic Metabolic Panel:  Recent Labs  Lab 07/20/19 1107 07/20/19 1107 07/20/19 1119 07/21/19 0445  NA 139   < > 140 139  K 4.2   < > 4.1 3.9  CL 105   < > 105 105  CO2 26  --   --  26  GLUCOSE 99   < > 96 104*  BUN 12   < > 13 10  CREATININE 0.83   < > 0.70 0.70  CALCIUM 9.6  --   --  9.2   < > = values in this interval not displayed.    Lipid Panel:     Component Value Date/Time   CHOL 179 07/21/2019 0445   TRIG 76 07/21/2019 0445   HDL 61 07/21/2019 0445   CHOLHDL 2.9 07/21/2019 0445   VLDL 15 07/21/2019 0445   LDLCALC 103 (H) 07/21/2019 0445   HgbA1c:  Lab Results  Component Value Date   HGBA1C 5.7 (H) 07/21/2019   Urine Drug Screen:     Component Value Date/Time   LABOPIA NONE DETECTED 07/20/2019 1220   COCAINSCRNUR NONE DETECTED 07/20/2019 1220   LABBENZ NONE DETECTED 07/20/2019 1220   AMPHETMU NONE DETECTED 07/20/2019 1220   THCU NONE DETECTED 07/20/2019 1220   LABBARB NONE DETECTED 07/20/2019 1220    Alcohol Level     Component Value Date/Time   ETH <10 07/20/2019 1107    IMAGING  DG Chest 2  View  07/20/2019 IMPRESSION:  No active cardiopulmonary disease.   CT Head Wo Contrast 07/20/2019 IMPRESSION:  Chronic small vessel disease throughout the deep white matter. No acute intracranial abnormality.   MR ANGIO HEAD WO CONTRAST 07/20/2019 IMPRESSION: Normal intracranial MRA.   MR BRAIN WO CONTRAST 07/20/2019 IMPRESSION:  Negative MRI head. Prominent perivascular spaces.   VAS 07/22/2019 CAROTID 07/21/2019 Summary:  Right Carotid: The extracranial vessels were near-normal with only minimal wall thickening or plaque.  Left Carotid: The extracranial vessels were near-normal with only minimal wall thickening or plaque.  Vertebrals:  Bilateral vertebral arteries demonstrate antegrade flow.  Subclavians: Normal flow hemodynamics were seen in bilateral subclavian arteries.  Preliminary    Transthoracic Echocardiogram  1. Left ventricular ejection fraction, by estimation, is 60 to 65%. The  left ventricle has normal function. The left ventricle has no  regional  wall motion abnormalities. Left ventricular diastolic parameters are  indeterminate. Transverse false tendon in  apical LV (normal variant).  2. Right ventricular systolic function is normal. The right ventricular  size is normal. Tricuspid regurgitation signal is inadequate for assessing  PA pressure.  3. The mitral valve is grossly normal. Trivial mitral valve  regurgitation.  4. The aortic valve was not well visualized. Aortic valve regurgitation  is not visualized.  5. The inferior vena cava is normal in size with greater than 50%  respiratory variability, suggesting right atrial pressure of 3 mmHg.   ECG - SR rate 67 BPM. (See cardiology reading for complete details)   PHYSICAL EXAM  Temp:  [97.8 F (36.6 C)-98.9 F (37.2 C)] 98.3 F (36.8 C) (05/15 1550) Pulse Rate:  [58-83] 67 (05/15 1550) Resp:  [13-20] 20 (05/15 1550) BP: (111-150)/(56-86) 150/71 (05/15 1550) SpO2:  [94 %-100 %] 99 % (05/15  1550)  General - Well nourished, well developed, in no apparent distress.  Ophthalmologic - fundi not visualized due to noncooperation.  Cardiovascular - Regular rhythm and rate.  Mental Status -  Level of arousal and orientation to time, place, and person were intact. Language including expression, naming, repetition, comprehension was assessed and found intact. Fund of Knowledge was assessed and was intact.  Cranial Nerves II - XII - II - Visual field intact OU. III, IV, VI - Extraocular movements intact. V - Facial sensation subjectively decreased on the right, about 50% of the left.Marland Kitchen VII - Facial movement intact bilaterally, however pt distorting right face herself. VIII - Hearing & vestibular intact bilaterally. X - Palate elevates symmetrically. XI - Chin turning & shoulder shrug intact bilaterally. XII - Tongue protrusion intact.  Motor Strength - The patient's strength was normal in all extremities and pronator drift was absent except mild giveaway weakness on the right UE and LE, more so on the UE.  Bulk was normal and fasciculations were absent.   Motor Tone - Muscle tone was assessed at the neck and appendages and was normal.  Reflexes - The patient's reflexes were symmetrical in all extremities and she had no pathological reflexes.  Sensory - Light touch, temperature/pinprick were assessed and were symmetrical.    Coordination - The patient had normal movements in the hands with no ataxia or dysmetria.  Tremor was absent.  Gait and Station - deferred.   ASSESSMENT/PLAN [name] is a 64 y.o. female with history of Htn, thyroid nodule and asthma presenting to [facility] after awakening with right-sided numbness / weakness.   She did not receive IV t-PA due to late presentation (>4.5 hours from time of onset)  Possible TIA    CT head - Chronic small vessel disease throughout the deep white matter. No acute intracranial abnormality.   MRI head - Negative  MRI head. Prominent perivascular spaces.   MRA head - Normal  Carotid Doppler - essentially normal  2D Echo EF 60-65%  [employer] Virus 2 - negative  LDL - 103  HgbA1c - 5.7  UDS - negative  VTE prophylaxis - Lovenox  No antithrombotic prior to admission, now on aspirin 81 mg daily and clopidogrel 75 mg daily.  Continue DAPT for 3 weeks and then aspirin alone.  Patient counseled to be compliant with her antithrombotic medications  Ongoing aggressive stroke risk factor management  Therapy recommendations: Home PT/OT  Disposition:  Pending  Hypertension  Home BP meds: none   Current BP meds: none  Stable .  Permissive hypertension (OK if < 220/120) but gradually normalize in 5-7 days  . Long-term BP goal normotensive  Hyperlipidemia  Home Lipid lowering medication: none  LDL 103, goal < 70  Current lipid lowering medication: Lipitor 40 mg daily   Continue statin at discharge  Other Stroke Risk Factors  Advanced age  Obesity, Body mass index is 34.77 kg/m., recommend weight loss, diet and exercise as appropriate   Other Active Problems  Code status - Full Code  Mild leukopenia, WBC 3.7   Hospital day # 1  Neurology will sign off. Please call with questions. Pt will follow up with stroke clinic NP at [facility] in about 4 weeks. Thanks for the consult.   Rosalin Hawking, MD PhD Stroke Neurology 07/21/2019 5:07 PM    To contact Stroke Continuity provider, please refer to [email or web address] After hours, contact General Neurology

## 2019-07-21 NOTE — Progress Notes (Signed)
VASCULAR LAB PRELIMINARY  PRELIMINARY  PRELIMINARY  PRELIMINARY  Carotid duplex completed.    Preliminary report:  See CV proc for preliminary results.  Charlita Brian, RVT 07/21/2019, 11:20 AM

## 2019-07-21 NOTE — Progress Notes (Signed)
OT Cancellation Note  Patient Details Name: Kerri Johnson MRN: [id number] DOB: Aug 14, 1955   Cancelled Treatment:    Reason Eval/Treat Not Completed: Other (comment)(Per PT whom saw patient, pt feeling ill after BPPV session. Will follow up next date as appropriate to initiate OT POC.)  Dalphine Handing, MSOT, OTR/L Acute Rehabilitation Services [facility] Office Number: [phone] Pager: [phone]  Dalphine Handing 07/21/2019, 4:51 PM

## 2019-07-22 ENCOUNTER — Encounter (HOSPITAL_COMMUNITY): Payer: Self-pay | Admitting: Internal Medicine

## 2019-07-22 DIAGNOSIS — I10 Essential (primary) hypertension: Secondary | ICD-10-CM | POA: Diagnosis not present

## 2019-07-22 MED ORDER — MECLIZINE HCL 12.5 MG PO TABS: 12.5000 mg | ORAL_TABLET | Freq: Three times a day (TID) | ORAL | Status: DC | PRN

## 2019-07-22 MED ORDER — MECLIZINE HCL 12.5 MG PO TABS: 12.5000 mg | ORAL_TABLET | Freq: Three times a day (TID) | ORAL | 0 refills | Status: AC | PRN

## 2019-07-22 MED ORDER — ATORVASTATIN CALCIUM 40 MG PO TABS: 40.0000 mg | ORAL_TABLET | Freq: Every day | ORAL | 0 refills | Status: DC

## 2019-07-22 MED ORDER — ASPIRIN 81 MG PO TBEC: 81.0000 mg | DELAYED_RELEASE_TABLET | Freq: Every day | ORAL | 0 refills | Status: AC

## 2019-07-22 MED ORDER — CLOPIDOGREL BISULFATE 75 MG PO TABS: 75.0000 mg | ORAL_TABLET | Freq: Every day | ORAL | 0 refills | Status: AC

## 2019-07-22 NOTE — TOC Transition Note (Signed)
Transition of Care [facility]) - CM/SW Discharge Note   Patient Details  Name: Kerri Johnson MRN: [id number] Date of Birth: [birth year]  [facility] CM/SW Contact:  Lawerance Sabal, RN Phone Number: 07/22/2019, 9:25 AM   Clinical Narrative:    Spoke w patient, she declines DME and HH. She states she has help and support at home.  No other CM needs identified.     Final next level of care: Home/Self Care Barriers to Discharge: No Barriers Identified   Patient Goals and CMS Choice        Discharge Placement                       Discharge Plan and Services                          HH Arranged: Patient Refused HH          Social Determinants of Health (SDOH) Interventions     Readmission Risk Interventions No flowsheet data found.

## 2019-07-22 NOTE — Discharge Summary (Addendum)
Discharge Summary  Kerri Johnson GYK:[id number] DOB: 10-16-1955  PCP: Kendrick Ranch, MD  Admit date: 07/20/2019 Discharge date: 07/22/2019  Time spent: 35 minutes  Recommendations for Outpatient Follow-up:  1. Follow-up with neurology in 1-2 weeks 2. Follow-up with your pulmonologist 3. Follow-up with your cardiologist 4. Follow-up with your primary care provider 5. Take your medications as prescribed 6. Continue PT OT with assistance and fall precautions  Discharge Diagnoses:  Active Hospital Problems   Diagnosis Date Noted  . TIA (transient ischemic attack) 07/20/2019  . CVA (cerebral vascular accident) (HCC) 07/20/2019  . Obesity (BMI 30-39.9) 11/30/2015  . Moderate persistent allergic asthma without complication 05/10/2014  . HTN (hypertension) 11/16/2013    Resolved Hospital Problems  No resolved problems to display.    Discharge Condition: Stable  Diet recommendation: Heart healthy diet  Vitals:   07/22/19 0753 07/22/19 0756  BP: (!) 132/58   Pulse: 72   Resp: 20   Temp: 98 F (36.7 C)   SpO2: 99% 99%    History of present illness:  Kerri Johnson a 64 y.o.right-handedfemalewith medical history significant ofhypertension, thyroid nodule, asthma, GERD, migraine headaches,and TIAwho presented to [facility] from home with complaintsof headache with right facial numbness and weakness. On 07/19/19 around 10 PM she started to have a right-sided headache, but did not notice any deficits until 07/20/19 at around 8 AM. She felt that the right side of her face was tingling including her lips and part of her upper shoulder. Noted associated symptoms of drooping of the right side of her mouth and feeling that she was talking funny. Noted similar symptoms with prior TIA. Denied having any changes in her vision, chest pain, nausea, vomiting, abdominal pain, dysuria, diarrhea, loss of consciousness, or recent falls. She notes associated symptoms of  having intermittent palpitations for which she is under the care of cardiology, but they have not formally diagnosed her with any, arrhythmia. She normally gets migraine headaches from time to time with aura, but denied having any aura with the headache last night. Furthermore she has a history of vertigo and notes that here lately whenever going to sleep at night feels like the room is spinning around her. She called her doctor's office who told her to call 911. EMS noted patient's blood pressure to be into the 170s and initial blood glucose was within normal limits.  ED Course:Upon admission labs are unremarkable. UDS and UA negative. COVID-19 screening was negative. Patient was not a candidate for TPA. Seen by Neurology, recommended MRI brain and completion of stroke work-up.  Seen by PT, on 07/21/19 had Epley maneuver to treat BPPV.  07/22/19:  Seen and examined.  Feels better this AM.  Dizziness improved.  R facial tingling resolving.  She has no new complaints.  Vital signs and labs are stable.     Hospital Course:  Principal Problem:   TIA (transient ischemic attack) Active Problems:   HTN (hypertension)   Moderate persistent allergic asthma without complication   Obesity (BMI 30-39.9)   CVA (cerebral vascular accident) (HCC)  Possible TIA: Acute.  Patient presented with right-sided facial numbness and tingling after having headache the night before.  CT and MRI of the brain negative for any acute intracranial abnormalities. Prominent perivascular spaces.  Chronic small vessel disease throughout the deep white matter. MRA head normal Carotid Doppler ultrasound no significant stenosis. Differential includes TIA versus a complex migraine possibly. -Seen by neurology/stroke team Continue aspirin 81 mg daily and Plavix  75 mg daily for 3 weeks then aspirin alone as recommended by neurology. LDL 103, goal LDL less than 70 Hemoglobin A1c 5.7, goal less than 7.0, at  goal. 2D echo LVEF 60 to 65%. Follow-up with neurology. Advised on the importance of medication compliance.  BPPV post Epley's maneuver by PT on 07/21/19 Added PRN Meclizine for dizziness. Follow up with neurology Continue fall precautions.  Ambulatory dysfunction PT assessed and recommended home health PT, patient declined, states she has family help at home. Continue fall precaution  Essential hypertension:  Initial blood pressures elevated up to 161/92. She is not on any blood pressure medications at home. Would allow for permissive hypertension in the acute setting. Follow-up with your primary care provider  Migraine headaches -Tylenol as needed for headache  Asthma:  Stable.Patient without wheezing noted on physical exam -Continue home regimen and bronchodilators O2 saturation 99% on room air Follow-up with your pulmonologist  Intermittent, resolved, palpitations Seen by cardiology outpatient Follow-up with your cardiologist  Obesity BMI 34.77 kg/m Weight loss outpatient with regular physical activity and healthy dieting     Code Status:Full    Consults called:Neurology    Discharge Exam: BP (!) 132/58 (BP Location: Left Arm)   Pulse 72   Temp 98 F (36.7 C) (Oral)   Resp 20   Ht 5\' 7"  (1.702 m)   Wt 100.7 kg   SpO2 99%   BMI 34.77 kg/m  . General: 64 y.o. year-old female well developed well nourished in no acute distress.  Alert and oriented x3. . Cardiovascular: Regular rate and rhythm with no rubs or gallops.  Marland Kitchen. Respiratory: Clear to auscultation with no wheezes or rales. . Abdomen: Soft nontender nondistended with normal bowel sounds. . Musculoskeletal: No lower extremity edema. Marland Kitchen. Psychiatry: Mood is appropriate for condition and setting  Discharge Instructions You were cared for by a hospitalist during your hospital stay. If you have any questions about your discharge medications or the care you received while you were in  the hospital after you are discharged, you can call the unit and asked to speak with the hospitalist on call if the hospitalist that took care of you is not available. Once you are discharged, your primary care physician will handle any further medical issues. Please note that NO REFILLS for any discharge medications will be authorized once you are discharged, as it is imperative that you return to your primary care physician (or establish a relationship with a primary care physician if you do not have one) for your aftercare needs so that they can reassess your need for medications and monitor your lab values.  Discharge Instructions    Ambulatory referral to Neurology   Complete by: As directed    Follow up with stroke clinic NP (Jessica Vanschaick or Darrol Angelarolyn Martin, if both not available, consider Manson AllanSethi, Penumali, or Ahern) [facility]. Thanks.     Allergies as of 07/22/2019      Reactions   Iodine Anaphylaxis   Shellfish Allergy Anaphylaxis   Versed [midazolam] Other (See Comments)   Effects memory      Medication List    TAKE these medications   acetaminophen 325 MG tablet Commonly known as: TYLENOL Take 650 mg by mouth every 6 (six) hours as needed for moderate pain or headache.   albuterol 108 (90 Base) MCG/ACT inhaler Commonly known as: Ventolin HFA ONHALE 2 PUFFS BY MOUTH EVERY 4 HOURS AS NEEDED FOR FOR TROUBLE BREATHING What changed:  how much to take  how to take this  when to take this  reasons to take this  additional instructions   aspirin 81 MG EC tablet Take 1 tablet (81 mg total) by mouth daily.   atorvastatin 40 MG tablet Commonly known as: LIPITOR Take 1 tablet (40 mg total) by mouth daily.   clopidogrel 75 MG tablet Commonly known as: PLAVIX Take 1 tablet (75 mg total) by mouth daily for 21 days.   meclizine 12.5 MG tablet Commonly known as: ANTIVERT Take 1 tablet (12.5 mg total) by mouth 3 (three) times daily as needed for up to 3  days for dizziness.   Qvar RediHaler 40 MCG/ACT inhaler Generic drug: beclomethasone Inhale 2 puffs into the lungs 2 (two) times daily. What changed: when to take this   VITAMIN D3 PO Take 1 capsule by mouth daily.            Durable Medical Equipment  (From admission, onward)         Start     Ordered   07/21/19 1734  For home use only DME Walker rolling  Once    Question Answer Comment  Walker: With 5 Inch Wheels   Patient needs a walker to treat with the following condition Ambulatory dysfunction      07/21/19 1734         Allergies  Allergen Reactions  . Iodine Anaphylaxis  . Shellfish Allergy Anaphylaxis  . Versed [Midazolam] Other (See Comments)    Effects memory   Follow-up Information    [facility]. Schedule an appointment as soon as possible for a visit in 4 week(s).   Specialty: Neurology Contact information: [address] [place] [address] [phone]       Kendrick Ranch, MD. Call.   Specialty: Internal Medicine Why: please call for a post hospital follow up appointment Contact information: [address] [place] [address] [phone]        Lars Masson, MD. Call in 1 day(s).   Specialty: Cardiology Why: Please call for a post hospital follow-up appointment. Contact information: [address] [place] [address] [phone]        Nyoka Cowden, MD. Call in 1 day(s).   Specialty: Pulmonary Disease Why: Please call for a post hospital follow-up appointment Contact information: [address] [place] [address] [phone]            The results of significant diagnostics from this hospitalization (including imaging, microbiology, ancillary and laboratory) are listed below for reference.    Significant Diagnostic Studies: DG Chest 2 View  Result Date: 07/20/2019 CLINICAL DATA:  CVA EXAM: CHEST - 2 VIEW  COMPARISON:  None. FINDINGS: The heart size and mediastinal contours are within normal limits. Both lungs are clear. The visualized skeletal structures are unremarkable. IMPRESSION: No active cardiopulmonary disease. Electronically Signed   By: Charlett Nose M.D.   On: 07/20/2019 15:47   CT Head Wo Contrast  Result Date: 07/20/2019 CLINICAL DATA:  Headache EXAM: CT HEAD WITHOUT CONTRAST TECHNIQUE: Contiguous axial images were obtained from the base of the skull through the vertex without intravenous contrast. COMPARISON:  12/10/2016 FINDINGS: Brain: Chronic microvascular disease throughout the deep white matter. No acute intracranial abnormality. Specifically, no hemorrhage, hydrocephalus, mass lesion, acute infarction, or significant intracranial injury. Vascular: No hyperdense vessel or unexpected calcification. Skull: No acute calvarial abnormality. Sinuses/Orbits: Visualized paranasal sinuses and mastoids clear.  Orbital soft tissues unremarkable. Other: None IMPRESSION: Chronic small vessel disease throughout the deep white matter. No acute intracranial abnormality. Electronically Signed   By: Charlett Nose M.D.   On: 07/20/2019 14:15   MR ANGIO HEAD WO CONTRAST  Result Date: 07/20/2019 CLINICAL DATA:  Stroke follow-up EXAM: MRA HEAD WITHOUT CONTRAST TECHNIQUE: Angiographic images of the Circle of Willis were obtained using MRA technique without intravenous contrast. COMPARISON:  Brain MRI 07/20/2019 FINDINGS: POSTERIOR CIRCULATION: --Vertebral arteries: Left vertebral artery terminates in PICA. Normal right V4 segment. --Inferior cerebellar arteries: Normal. --Basilar artery: Normal. --Superior cerebellar arteries: Normal. --Posterior cerebral arteries: Normal. Both are predominantly supplied by the posterior communicating arteries (p-comm). ANTERIOR CIRCULATION: --Intracranial internal carotid arteries: Normal. --Anterior cerebral arteries (ACA): Normal. Both A1 segments are present. Patent anterior  communicating artery (a-comm). --Middle cerebral arteries (MCA): Normal. IMPRESSION: Normal intracranial MRA. Electronically Signed   By: Deatra Robinson M.D.   On: 07/20/2019 23:29   MR BRAIN WO CONTRAST  Result Date: 07/20/2019 CLINICAL DATA:  Neuro deficit, acute EXAM: MRI HEAD WITHOUT CONTRAST TECHNIQUE: Multiplanar, multiecho pulse sequences of the brain and surrounding structures were obtained without intravenous contrast. COMPARISON:  CT head 07/20/2019 FINDINGS: Brain: Ventricle size normal. Cerebral volume normal. Prominent perivascular spaces in the cerebral white matter bilaterally Negative for acute infarct, hemorrhage, mass. No significant chronic ischemia. Vascular: Normal arterial flow voids. Skull and upper cervical spine: Negative Sinuses/Orbits: Mild mucosal edema paranasal sinuses.  Normal orbit Other: None IMPRESSION: Negative MRI head. Prominent perivascular spaces. Electronically Signed   By: Marlan Palau M.D.   On: 07/20/2019 16:34   ECHOCARDIOGRAM COMPLETE  Result Date: 07/21/2019    ECHOCARDIOGRAM REPORT   Patient Name:   Alka RUBEN PYKA Date of Exam: 07/21/2019 Medical Rec #:  [id number]             Height:       67.0 in Accession #:    [id number]            Weight:       222.0 lb Date of Birth:  1955/09/13             BSA:          2.114 m Patient Age:    63 years              BP:           146/56 mmHg Patient Gender: F                     HR:           70 bpm. Exam Location:  Inpatient Procedure: 2D Echo, Cardiac Doppler and Color Doppler Indications:    Stroke 434.91/I163.9  History:        Patient has prior history of Echocardiogram examinations, most                 recent 01/13/2017. Risk Factors:Hypertension. GERD.  Sonographer:    Ross Ludwig RDCS (AE) Referring Phys: [id number] [facility] A SMITH  Sonographer Comments: Patient is morbidly obese and suboptimal parasternal window. Image acquisition challenging due to respiratory motion and Image acquisition challenging due to  patient body habitus. IMPRESSIONS  1. Left ventricular ejection fraction, by estimation, is 60 to 65%. The left ventricle has normal function. The left ventricle has no regional wall motion abnormalities. Left ventricular diastolic parameters are indeterminate. Transverse false tendon in  apical LV (normal variant).  2. Right ventricular systolic function is normal.  The right ventricular size is normal. Tricuspid regurgitation signal is inadequate for assessing PA pressure.  3. The mitral valve is grossly normal. Trivial mitral valve regurgitation.  4. The aortic valve was not well visualized. Aortic valve regurgitation is not visualized.  5. The inferior vena cava is normal in size with greater than 50% respiratory variability, suggesting right atrial pressure of 3 mmHg. FINDINGS  Left Ventricle: Left ventricular ejection fraction, by estimation, is 60 to 65%. The left ventricle has normal function. The left ventricle has no regional wall motion abnormalities. The left ventricular internal cavity size was normal in size. There is  borderline left ventricular hypertrophy. Left ventricular diastolic parameters are indeterminate. Right Ventricle: The right ventricular size is normal. No increase in right ventricular wall thickness. Right ventricular systolic function is normal. Tricuspid regurgitation signal is inadequate for assessing PA pressure. Left Atrium: Left atrial size was normal in size. Right Atrium: Right atrial size was normal in size. Pericardium: There is no evidence of pericardial effusion. Presence of pericardial fat pad. Mitral Valve: The mitral valve is grossly normal. Trivial mitral valve regurgitation. MV peak gradient, 2.4 mmHg. The mean mitral valve gradient is 1.0 mmHg. Tricuspid Valve: The tricuspid valve is grossly normal. Tricuspid valve regurgitation is trivial. Aortic Valve: The aortic valve was not well visualized. Aortic valve regurgitation is not visualized. Aortic valve mean gradient  measures 3.0 mmHg. Aortic valve peak gradient measures 4.7 mmHg. Aortic valve area, by VTI measures 3.37 cm. Pulmonic Valve: The pulmonic valve was not well visualized. Pulmonic valve regurgitation is not visualized. Aorta: The aortic root is normal in size and structure. Venous: The inferior vena cava is normal in size with greater than 50% respiratory variability, suggesting right atrial pressure of 3 mmHg. IAS/Shunts: No atrial level shunt detected by color flow Doppler.  LEFT VENTRICLE PLAX 2D LVIDd:         4.60 cm  Diastology LVIDs:         3.00 cm  LV e' lateral:   8.92 cm/s LV PW:         1.00 cm  LV E/e' lateral: 5.2 LV IVS:        1.00 cm  LV e' medial:    7.29 cm/s LVOT diam:     1.90 cm  LV E/e' medial:  6.3 LV SV:         67 LV SV Index:   32 LVOT Area:     2.84 cm  RIGHT VENTRICLE             IVC RV Basal diam:  2.90 cm     IVC diam: 1.10 cm RV S prime:     13.80 cm/s TAPSE (M-mode): 2.0 cm LEFT ATRIUM             Index       RIGHT ATRIUM           Index LA diam:        2.00 cm 0.95 cm/m  RA Area:     12.70 cm LA Vol (A2C):   34.2 ml 16.18 ml/m RA Volume:   28.10 ml  13.29 ml/m LA Vol (A4C):   20.2 ml 9.56 ml/m LA Biplane Vol: 27.5 ml 13.01 ml/m  AORTIC VALVE AV Area (Vmax):    2.49 cm AV Area (Vmean):   2.46 cm AV Area (VTI):     3.37 cm AV Vmax:           108.00 cm/s AV Vmean:  80.300 cm/s AV VTI:            0.200 m AV Peak Grad:      4.7 mmHg AV Mean Grad:      3.0 mmHg LVOT Vmax:         94.70 cm/s LVOT Vmean:        69.700 cm/s LVOT VTI:          0.238 m LVOT/AV VTI ratio: 1.19  AORTA Ao Root diam: 2.90 cm Ao Asc diam:  2.90 cm MITRAL VALVE MV Area (PHT): 1.96 cm    SHUNTS MV Peak grad:  2.4 mmHg    Systemic VTI:  0.24 m MV Mean grad:  1.0 mmHg    Systemic Diam: 1.90 cm MV Vmax:       0.78 m/s MV Vmean:      47.6 cm/s MV Decel Time: 387 msec MV E velocity: 46.00 cm/s MV A velocity: 65.60 cm/s MV E/A ratio:  0.70 Rozann Lesches MD Electronically signed by Rozann Lesches MD  Signature Date/Time: 07/21/2019/12:35:35 PM    Final    VAS US CAROTID  Result Date: 07/21/2019 Carotid Arterial Duplex Study Indications:       TIA, Numbness, Weakness and Facial droop, headache. Risk Factors:      Hypertension. Comparison Study:  No prior study on file for comparison Performing Technologist: Sharion Dove RVS  Examination Guidelines: A complete evaluation includes B-mode imaging, spectral Doppler, color Doppler, and power Doppler as needed of all accessible portions of each vessel. Bilateral testing is considered an integral part of a complete examination. Limited examinations for reoccurring indications may be performed as noted.  Right Carotid Findings: +----------+--------+--------+--------+------------------+------------------+           PSV cm/sEDV cm/sStenosisPlaque DescriptionComments           +----------+--------+--------+--------+------------------+------------------+ CCA Prox  87      13                                intimal thickening +----------+--------+--------+--------+------------------+------------------+ CCA Distal106     33                                intimal thickening +----------+--------+--------+--------+------------------+------------------+ ICA Prox  96      32                                                   +----------+--------+--------+--------+------------------+------------------+ ICA Distal109     47                                                   +----------+--------+--------+--------+------------------+------------------+ ECA       81      12                                                   +----------+--------+--------+--------+------------------+------------------+ +----------+--------+-------+--------+-------------------+           PSV cm/sEDV cmsDescribeArm Pressure (mmHG) +----------+--------+-------+--------+-------------------+ [id number]                                          +----------+--------+-------+--------+-------------------+ +---------+--------+--+--------+--+  VertebralPSV cm/s75EDV cm/s22 +---------+--------+--+--------+--+  Left Carotid Findings: +----------+--------+--------+--------+------------------+------------------+           PSV cm/sEDV cm/sStenosisPlaque DescriptionComments           +----------+--------+--------+--------+------------------+------------------+ CCA Prox  113     29                                intimal thickening +----------+--------+--------+--------+------------------+------------------+ CCA Distal113     33                                intimal thickening +----------+--------+--------+--------+------------------+------------------+ ICA Prox  80      25                                                   +----------+--------+--------+--------+------------------+------------------+ ICA Distal53      25                                                   +----------+--------+--------+--------+------------------+------------------+ ECA       89      13                                                   +----------+--------+--------+--------+------------------+------------------+ +----------+--------+--------+--------+-------------------+           PSV cm/sEDV cm/sDescribeArm Pressure (mmHG) +----------+--------+--------+--------+-------------------+ [id number]                                         +----------+--------+--------+--------+-------------------+ +---------+--------+--+--------+-+ VertebralPSV cm/s37EDV cm/s1 +---------+--------+--+--------+-+   Summary: Right Carotid: The extracranial vessels were near-normal with only minimal wall                thickening or plaque. Left Carotid: The extracranial vessels were near-normal with only minimal wall               thickening or plaque. Vertebrals:  Bilateral vertebral arteries demonstrate antegrade flow. Subclavians: Normal flow  hemodynamics were seen in bilateral subclavian              arteries. *See table(s) above for measurements and observations.     Preliminary     Microbiology: Recent Results (from the past 240 hour(s))  SARS Coronavirus 2 by RT PCR (hospital order, performed in [facility] hospital lab) Nasopharyngeal Nasopharyngeal Swab     Status: None   Collection Time: 07/20/19  1:05 PM   Specimen: Nasopharyngeal Swab  Result Value Ref Range Status   SARS Coronavirus 2 NEGATIVE NEGATIVE Final    Comment: (NOTE) SARS-CoV-2 target nucleic acids are NOT DETECTED. The SARS-CoV-2 RNA is generally detectable in upper and lower respiratory specimens during the acute phase of infection. The lowest concentration of SARS-CoV-2 viral copies this assay can detect is 250 copies / mL. A negative result does not preclude SARS-CoV-2 infection and should not be used as the sole basis for treatment or  other patient management decisions.  A negative result may occur with improper specimen collection / handling, submission of specimen other than nasopharyngeal swab, presence of viral mutation(s) within the areas targeted by this assay, and inadequate number of viral copies (<250 copies / mL). A negative result must be combined with clinical observations, patient history, and epidemiological information. Fact Sheet for Patients:   BoilerBrush.com.cy Fact Sheet for Healthcare Providers: https://pope.com/ This test is not yet approved or cleared  by the [place] FDA and has been authorized for detection and/or diagnosis of SARS-CoV-2 by FDA under an Emergency Use Authorization (EUA).  This EUA will remain in effect (meaning this test can be used) for the duration of the COVID-19 declaration under Section 564(b)(1) of the Act, 21 U.S.C. section 360bbb-3(b)(1), unless the authorization is terminated or revoked sooner. Performed at [facility], [address], [place], [place] [address]      Labs: Basic Metabolic Panel: Recent Labs  Lab 07/20/19 1107 07/20/19 1119 07/21/19 0445  NA 139 140 139  K 4.2 4.1 3.9  CL 105 105 105  CO2 26  --  26  GLUCOSE 99 96 104*  BUN 12 13 10   CREATININE 0.83 0.70 0.70  CALCIUM 9.6  --  9.2   Liver Function Tests: Recent Labs  Lab 07/20/19 1107  AST 30  ALT 33  ALKPHOS 75  BILITOT 0.9  PROT 7.0  ALBUMIN 3.8   No results for input(s): LIPASE, AMYLASE in the last 168 hours. No results for input(s): AMMONIA in the last 168 hours. CBC: Recent Labs  Lab 07/20/19 1107 07/20/19 1119 07/21/19 0445  WBC 4.4  --  3.7*  NEUTROABS 1.9  --   --   HGB 13.1 13.9 12.9  HCT 42.5 41.0 40.5  MCV 92.6  --  91.0  PLT 244  --  218   Cardiac Enzymes: No results for input(s): CKTOTAL, CKMB, CKMBINDEX, TROPONINI in the last 168 hours. BNP: BNP (last 3 results) No results for input(s): BNP in the last 8760 hours.  ProBNP (last 3 results) No results for input(s): PROBNP in the last 8760 hours.  CBG: Recent Labs  Lab 07/20/19 1102  GLUCAP [id number]       Signed:  07/22/19, MD [facility] 07/22/2019, 10:46 AM

## 2019-07-22 NOTE — Progress Notes (Signed)
Pt discharged home, AVS given, pt educatin provided, peripheral IVs removed, telemetry discontinued. Pt transported to [facility] by w/c.

## 2019-07-22 NOTE — Progress Notes (Signed)
SLP Cancellation Note  Patient Details Name: Kerri Johnson MRN: [id number] DOB: 05-02-1955   Cancelled treatment:       Reason Eval/Treat Not Completed: Patient at procedure or test/unavailable (Pt fully dressed and currently in process of being discharged from hospital with RN reviewing instructions.)   Kerri Johnson I. Vear Clock, MS, CCC-SLP Acute Rehabilitation Services Office number [phone] Pager [phone]  Kerri Johnson 07/22/2019, 3:12 PM

## 2019-07-22 NOTE — Evaluation (Signed)
Occupational Therapy Evaluation Patient Details Name: Kerri Johnson MRN: [id number] DOB: 01-27-1956 Today's Date: 07/22/2019    History of Present Illness 64 y.o. female with medical history significant of hypertension, thyroid nodule, asthma, GERD,migraine headaches,vertigo and TIA presented to hospital on 07/20/19 after awakened with right-sided weakness. Reports recently when she goes to bed at night the room is spinning. MRI brain negative for acute changes   Clinical Impression   Pt admitted with the above diagnosis.  She demonstrates decreased activity tolerance and impaired balance with OT.  She requires min guard assist for ADLs due to balance deficits.  She does report family will be available 24/7 to assist initially.  Recommend she sit to shower due to increased fall risk.   She verbalizes understanding of all recommendations.       Follow Up Recommendations  No OT follow up;Supervision/Assistance - 24 hour(initially, then tapering to intermittent assist )    Equipment Recommendations  None recommended by OT    Recommendations for Other Services       Precautions / Restrictions Precautions Precautions: Fall      Mobility Bed Mobility Overal bed mobility: Modified Independent             General bed mobility comments: moves cautiously   Transfers Overall transfer level: Needs assistance   Transfers: Sit to/from Stand;Stand Pivot Transfers Sit to Stand: Min guard Stand pivot transfers: Min guard       General transfer comment: min guard assist for safety due to mild balance deficits     Balance Overall balance assessment: Needs assistance Sitting-balance support: No upper extremity supported;Feet supported Sitting balance-Leahy Scale: Good     Standing balance support: During functional activity;No upper extremity supported Standing balance-Leahy Scale: Fair Standing balance comment: able to maintain static standing without UE support with  supervision                            ADL either performed or assessed with clinical judgement   ADL Overall ADL's : Needs assistance/impaired Eating/Feeding: Independent   Grooming: Wash/dry hands;Wash/dry face;Oral care;Brushing hair;Min guard;Standing   Upper Body Bathing: Set up;Sitting   Lower Body Bathing: Min guard;Sit to/from stand   Upper Body Dressing : Set up;Sitting   Lower Body Dressing: Min guard;Sit to/from stand   Toilet Transfer: Min guard;Ambulation;Comfort height toilet;Grab bars   Toileting- Clothing Manipulation and Hygiene: Min guard;Sit to/from stand       Functional mobility during ADLs: Min guard General ADL Comments: Pt requires min guard assist for safety/balance as she if mildly unsteady.  She reports dizziness when tilting head backwards and with bending forward when simulating shower in standing.  Discussed recommendation to sit to shower and utilize a long handled bath brush/sponge.  She verbalized understanding of recommendations      Vision Baseline Vision/History: Wears glasses Wears Glasses: At all times Patient Visual Report: No change from baseline Vision Assessment?: No apparent visual deficits     Perception     Praxis      Pertinent Vitals/Pain       Hand Dominance Right   Extremity/Trunk Assessment Upper Extremity Assessment Upper Extremity Assessment: RUE deficits/detail RUE Deficits / Details: grossly 4+/5   Lower Extremity Assessment Lower Extremity Assessment: Defer to PT evaluation       Communication Communication Communication: No difficulties   Cognition Arousal/Alertness: Awake/alert Behavior During Therapy: WFL for tasks assessed/performed Overall Cognitive Status: Within Functional Limits for tasks  assessed                                     General Comments  discussed safety at home, appropriate activity progression, and fall prevention strategies.  Pt demonstrates good  safety awareness     Exercises     Shoulder Instructions      Home Living Family/patient expects to be discharged to:: Private residence Living Arrangements: Spouse/significant other Available Help at Discharge: Family;Available 24 hours/day(pt reports her spouse or her sons will be available ) Type of Home: House Home Access: Stairs to enter Entrance Stairs-Number of Steps: 1+1 Entrance Stairs-Rails: None Home Layout: Able to live on main level with bedroom/bathroom;Two level(doesn't have to go upstairs)     Bathroom Shower/Tub: Occupational psychologist: Standard Bathroom Accessibility: Yes   Home Equipment: Shower seat          Prior Functioning/Environment Level of Independence: Independent        Comments: driving, completely independent.  very active in the community         OT Problem List: Decreased activity tolerance;Impaired balance (sitting and/or standing)      OT Treatment/Interventions:      OT Goals(Current goals can be found in the care plan section) Acute Rehab OT Goals Patient Stated Goal: to be able to take care of self and be active in the community  OT Goal Formulation: All assessment and education complete, DC therapy  OT Frequency:     Barriers to D/C:            Co-evaluation              AM-PAC OT "6 Clicks" Daily Activity     Outcome Measure Help from another person eating meals?: None Help from another person taking care of personal grooming?: A Little Help from another person toileting, which includes using toliet, bedpan, or urinal?: A Little Help from another person bathing (including washing, rinsing, drying)?: A Little Help from another person to put on and taking off regular upper body clothing?: A Little Help from another person to put on and taking off regular lower body clothing?: A Little 6 Click Score: 19   End of Session Equipment Utilized During Treatment: Gait belt Nurse Communication: Mobility  status  Activity Tolerance: Patient tolerated treatment well Patient left: in chair;with call bell/phone within reach;with chair alarm set  OT Visit Diagnosis: Unsteadiness on feet (R26.81)                Time: 0981-1914 OT Time Calculation (min): 35 min Charges:  OT General Charges $OT Visit: 1 Visit OT Evaluation $OT Eval Moderate Complexity: 1 Mod OT Treatments $Self Care/Home Management : 8-22 mins  Nilsa Nutting., OTR/L Acute Rehabilitation Services Pager [phone] Office [phone]   Lucille Passy M 07/22/2019, 10:52 AM

## 2019-07-22 NOTE — Progress Notes (Signed)
Physical Therapy Treatment Patient Details Name: Kerri Johnson MRN: [id number] DOB: Jun 18, 1955 Today's Date: 07/22/2019    History of Present Illness 64 y.o. female with medical history significant of hypertension, thyroid nodule, asthma, GERD,migraine headaches,vertigo and TIA presented to hospital on 07/20/19 after awakened with right-sided weakness. Reports recently when she goes to bed at night the room is spinning. MRI brain negative for acute changes    PT Comments    Patient assessed for BPPV and had negative Dix-Hallpike and reporting no spinning dizziness since yesterday's Epley maneuver. Noted nursing assessed her orthostatic BPs this a.m. and she did still drop her SBP 40mmHg, however reports she was not symptomatic.  She was able to ambulate with no device with minguard assist progressing to supervision as she was steady with no drift or staggering. Provided written information re: BPPV. See updates for discharge plan below.     Follow Up Recommendations  Supervision/Assistance - 24 hour;No PT follow up(family plans to provide 24/7 assist as needed)     Equipment Recommendations  None recommended by PT    Recommendations for Other Services       Precautions / Restrictions Precautions Precautions: Fall Restrictions Weight Bearing Restrictions: No    Mobility  Bed Mobility Overal bed mobility: Modified Independent Bed Mobility: Rolling;Sidelying to Sit;Sit to Sidelying Rolling: Modified independent (Device/Increase time)         General bed mobility comments: moves cautiously   Transfers Overall transfer level: Needs assistance Equipment used: None Transfers: Sit to/from Stand Sit to Stand: Min guard Stand pivot transfers: Min guard       General transfer comment: min guard assist for safety due to recent dizziness; pt denies dizziness and orthostatics negative  Ambulation/Gait Ambulation/Gait assistance: Min guard;Supervision Gait Distance  (Feet): 25 Feet(x2) Assistive device: None Gait Pattern/deviations: Decreased stride length;Wide base of support;Step-through pattern Gait velocity: slow, guarded   General Gait Details: patient steady and denying dizziness; appears cautious and does not feel safe to be up alone with supervision provided   Stairs             Wheelchair Mobility    Modified Rankin (Stroke Patients Only)       Balance Overall balance assessment: Needs assistance Sitting-balance support: No upper extremity supported;Feet supported Sitting balance-Leahy Scale: Good     Standing balance support: During functional activity;No upper extremity supported Standing balance-Leahy Scale: Good Standing balance comment: able to reach outside base of support while standing                            Cognition Arousal/Alertness: Awake/alert Behavior During Therapy: WFL for tasks assessed/performed Overall Cognitive Status: Within Functional Limits for tasks assessed                                        Exercises      General Comments General comments (skin integrity, edema, etc.): Repeated left [facility] with no symptoms elicited. Pt reports no spinning getting into or out of bed since maneuver 5/15. Handout provided explaining BPPV and discussed it is spinning that lasts typically <60 seconds. IF she has spinning that lasts longer, this could be a sign of a stroke and she should seek medical care emergently       Pertinent Vitals/Pain Pain Assessment: No/denies pain    Home Living Family/patient expects to be  discharged to:: Private residence Living Arrangements: Spouse/significant other Available Help at Discharge: Family;Available 24 hours/day(pt reports her spouse or her sons will be available ) Type of Home: House Home Access: Stairs to enter Entrance Stairs-Rails: None Home Layout: Able to live on main level with bedroom/bathroom;Two level(doesn't have to  go upstairs) Home Equipment: Shower seat      Prior Function Level of Independence: Independent      Comments: driving, completely independent.  very active in the community    PT Goals (current goals can now be found in the care plan section) Acute Rehab PT Goals Patient Stated Goal: to be able to take care of self and be active in the community  PT Goal Formulation: With patient Time For Goal Achievement: 08/04/19 Potential to Achieve Goals: Good Progress towards PT goals: Progressing toward goals    Frequency    Min 4X/week      PT Plan Discharge plan needs to be updated;Equipment recommendations need to be updated    Co-evaluation              AM-PAC PT "6 Clicks" Mobility   Outcome Measure  Help needed turning from your back to your side while in a flat bed without using bedrails?: None Help needed moving from lying on your back to sitting on the side of a flat bed without using bedrails?: None Help needed moving to and from a bed to a chair (including a wheelchair)?: A Little Help needed standing up from a chair using your arms (e.g., wheelchair or bedside chair)?: A Little Help needed to walk in hospital room?: A Little Help needed climbing 3-5 steps with a railing? : A Little 6 Click Score: 20    End of Session Equipment Utilized During Treatment: Gait belt Activity Tolerance: Patient tolerated treatment well Patient left: with call bell/phone within reach;in chair;with chair alarm set;with family/visitor present(chair position) Nurse Communication: Mobility status PT Visit Diagnosis: Unsteadiness on feet (R26.81);Other symptoms and signs involving the nervous system (R29.898);BPPV BPPV - Right/Left : Left     Time: [id number] PT Time Calculation (min) (ACUTE ONLY): 27 min  Charges:  $Therapeutic Activity: 8-22 mins $Self Care/Home Management: 8-22                      Jerolyn Center, PT Pager [phone]    Kerri Johnson 07/22/2019, 1:03 PM

## 2019-07-22 NOTE — Discharge Instructions (Addendum)
Benign Positional Vertigo Vertigo is the feeling that you or your surroundings are moving when they are not. Benign positional vertigo is the most common form of vertigo. This is usually a harmless condition (benign). This condition is positional. This means that symptoms are triggered by certain movements and positions. This condition can be dangerous if it occurs while you are doing something that could cause harm to you or others. This includes activities such as driving or operating machinery. What are the causes? In many cases, the cause of this condition is not known. It may be caused by a disturbance in an area of the inner ear that helps your brain to sense movement and balance. This disturbance can be caused by:  Viral infection (labyrinthitis).  Head injury.  Repetitive motion, such as jumping, dancing, or running. What increases the risk? You are more likely to develop this condition if:  You are a woman.  You are 50 years of age or older. What are the signs or symptoms? Symptoms of this condition usually happen when you move your head or your eyes in different directions. Symptoms may start suddenly, and usually last for less than a minute. They include:  Loss of balance and falling.  Feeling like you are spinning or moving.  Feeling like your surroundings are spinning or moving.  Nausea and vomiting.  Blurred vision.  Dizziness.  Involuntary eye movement (nystagmus). Symptoms can be mild and cause only minor problems, or they can be severe and interfere with daily life. Episodes of benign positional vertigo may return (recur) over time. Symptoms may improve over time. How is this diagnosed? This condition may be diagnosed based on:  Your medical history.  Physical exam of the head, neck, and ears.  Tests, such as: ? MRI. ? CT scan. ? Eye movement tests. Your health care provider may ask you to change positions quickly while he or she watches you for symptoms  of benign positional vertigo, such as nystagmus. Eye movement may be tested with a variety of exams that are designed to evaluate or stimulate vertigo. ? An electroencephalogram (EEG). This records electrical activity in your brain. ? Hearing tests. You may be referred to a health care provider who specializes in ear, nose, and throat (ENT) problems (otolaryngologist) or a provider who specializes in disorders of the nervous system (neurologist). How is this treated?  This condition may be treated in a session in which your health care provider moves your head in specific positions to adjust your inner ear back to normal. Treatment for this condition may take several sessions. Surgery may be needed in severe cases, but this is rare. In some cases, benign positional vertigo may resolve on its own in 2-4 weeks. Follow these instructions at home: Safety  Move slowly. Avoid sudden body or head movements or certain positions, as told by your health care provider.  Avoid driving until your health care provider says it is safe for you to do so.  Avoid operating heavy machinery until your health care provider says it is safe for you to do so.  Avoid doing any tasks that would be dangerous to you or others if vertigo occurs.  If you have trouble walking or keeping your balance, try using a cane for stability. If you feel dizzy or unstable, sit down right away.  Return to your normal activities as told by your health care provider. Ask your health care provider what activities are safe for you. General instructions  Take over-the-counter   and prescription medicines only as told by your health care provider.  Drink enough fluid to keep your urine pale yellow.  Keep all follow-up visits as told by your health care provider. This is important. Contact a health care provider if:  You have a fever.  Your condition gets worse or you develop new symptoms.  Your family or friends notice any  behavioral changes.  You have nausea or vomiting that gets worse.  You have numbness or a "pins and needles" sensation. Get help right away if you:  Have difficulty speaking or moving.  Are always dizzy.  Faint.  Develop severe headaches.  Have weakness in your legs or arms.  Have changes in your hearing or vision.  Develop a stiff neck.  Develop sensitivity to light. Summary  Vertigo is the feeling that you or your surroundings are moving when they are not. Benign positional vertigo is the most common form of vertigo.  The cause of this condition is not known. It may be caused by a disturbance in an area of the inner ear that helps your brain to sense movement and balance.  Symptoms include loss of balance and falling, feeling that you or your surroundings are moving, nausea and vomiting, and blurred vision.  This condition can be diagnosed based on symptoms, physical exam, and other tests, such as MRI, CT scan, eye movement tests, and hearing tests.  Follow safety instructions as told by your health care provider. You will also be told when to contact your health care provider in case of problems. This information is not intended to replace advice given to you by your health care provider. Make sure you discuss any questions you have with your health care provider. Document Revised: 08/03/2017 Document Reviewed: 08/03/2017 Elsevier Patient Education  2020 [employer] Transient Ischemic Attack  A transient ischemic attack (TIA) is a "warning stroke" that causes stroke-like symptoms that go away quickly. A TIA does not cause lasting damage to the brain. But having a TIA is a sign that you may be at risk for a stroke. Lifestyle changes and medical treatments can help prevent a stroke. It is important to know the symptoms of a TIA and what to do. Get help right away, even if your symptoms go away. The symptoms of a TIA are the same as those of a stroke. They can happen fast,  and they usually go away within minutes or hours. They can include:  Weakness or loss of feeling in your face, arm, or leg. This often happens on one side of your body.  Trouble walking.  Trouble moving your arms or legs.  Trouble talking or understanding what people are saying.  Trouble seeing.  Seeing two of one object (double vision).  Feeling dizzy.  Feeling confused.  Loss of balance or coordination.  Feeling sick to your stomach (nauseous) and throwing up (vomiting).  A very bad headache for no reason. What increases the risk? Certain things may make you more likely to have a TIA. Some of these are things that you can change, such as:  Being very overweight (obese).  Using products that contain nicotine or tobacco, such as cigarettes and e-cigarettes.  Taking birth control pills.  Not being active.  Drinking too much alcohol.  Using drugs. Other risk factors include:  Having an irregular heartbeat (atrial fibrillation).  Being African American or Hispanic.  Having had blood clots, stroke, TIA, or heart attack in the past.  Being a woman with a history  of high blood pressure in pregnancy (preeclampsia).  Being over the age of 55.  Being female.  Having family history of stroke.  Having the following diseases or conditions: ? High blood pressure. ? High cholesterol. ? Diabetes. ? Heart disease. ? Sickle cell disease. ? Sleep apnea. ? Migraine headache. ? Long-term (chronic) diseases that cause soreness and swelling (inflammation). ? Disorders that affect how your blood clots. Follow these instructions at home: Medicines   Take over-the-counter and prescription medicines only as told by your doctor.  If you were told to take aspirin or another medicine to thin your blood, take it exactly as told by your doctor. ? Taking too much of the medicine can cause bleeding. ? Taking too little of the medicine may not work to treat the problem. Eating and  drinking   Eat 5 or more servings of fruits and vegetables each day.  Follow instructions from your doctor about your diet. You may need to follow a certain diet to help lower your risk of having a stroke. You may need to: ? Eat a diet that is low in fat and salt. ? Eat foods that contain a lot of fiber. ? Limit the amount of carbohydrates and sugar in your diet.  Limit alcohol intake to 1 drink a day for nonpregnant women and 2 drinks a day for men. One drink equals 12 oz of beer, 5 oz of wine, or 1 oz of hard liquor. General instructions  Keep a healthy weight.  Stay active. Try to get at least 30 minutes of activity on all or most days.  Find out if you have a condition called sleep apnea. Get treatment if needed.  Do not use any products that contain nicotine or tobacco, such as cigarettes and e-cigarettes. If you need help quitting, ask your doctor.  Do not abuse drugs.  Keep all follow-up visits as told by your doctor. This is important. Get help right away if:  You have any signs of stroke. "BE FAST" is an easy way to remember the main warning signs: ? B - Balance. Signs are dizziness, sudden trouble walking, or loss of balance. ? E - Eyes. Signs are trouble seeing or a sudden change in how you see. ? F - Face. Signs are sudden weakness or loss of feeling of the face, or the face or eyelid drooping on one side. ? A - Arms. Signs are weakness or loss of feeling in an arm. This happens suddenly and usually on one side of the body. ? S - Speech. Signs are sudden trouble speaking, slurred speech, or trouble understanding what people say. ? T - Time. Time to call emergency services. Write down what time symptoms started.  You have other signs of stroke, such as: ? A sudden, very bad headache with no known cause. ? Feeling sick to your stomach (nausea). ? Throwing up (vomiting). ? Jerky movements that you cannot control (seizure). These symptoms may be an emergency. Do not  wait to see if the symptoms will go away. Get medical help right away. Call your local emergency services (911 in the [place] Do not drive yourself to the hospital. Summary  A transient ischemic attack (TIA) is a "warning stroke" that causes stroke-like symptoms that go away quickly.  A TIA is a medical emergency. Get help right away, even if your symptoms go away.  A TIA does not cause lasting damage to the brain.  Having a TIA is a sign that you may  be at risk for a stroke. Lifestyle changes and medical treatments can help prevent a stroke. This information is not intended to replace advice given to you by your health care provider. Make sure you discuss any questions you have with your health care provider. Document Revised: 11/18/2017 Document Reviewed: 05/26/2016 Elsevier Patient Education  [place].

## 2019-07-23 DIAGNOSIS — G459 Transient cerebral ischemic attack, unspecified: Secondary | ICD-10-CM | POA: Diagnosis not present

## 2019-07-23 DIAGNOSIS — G510 Bell's palsy: Secondary | ICD-10-CM | POA: Diagnosis not present

## 2019-07-23 DIAGNOSIS — K219 Gastro-esophageal reflux disease without esophagitis: Secondary | ICD-10-CM | POA: Diagnosis not present

## 2019-08-20 ENCOUNTER — Telehealth: Payer: Self-pay

## 2019-08-20 NOTE — Progress Notes (Signed)
Cardiology Office Note:    Date:  08/31/2019   ID:  Kerri, Johnson [birth year], MRN [id number]  PCP:  Lorenda Ishihara, MD  Cardiologist:  Tobias Alexander, MD  Electrophysiologist:  None   Referring MD: Lorenda Ishihara,*   Chief Complaint: pre-op evaluation for colonoscopy   History of Present Illness:    Kerri Johnson is a 64 y.o. female with a history of palpitations with PACs noted on monitor in 2018, hypertension, possible TIA recently, vertigo, and GERD who is followed by Dr. Delton See and presents today for pre-op evaluation for upcoming colonoscopy.  Patient previously lived in [place] before moving to [place] in 2015. She reportedly had a cardiac catheterization while living in [place] and was told she did not have any blockages. She was referred to Dr. Patty Sermons in the 2015 for evaluation of chest pain. Nuclear stress test was ordered for further evaluation and was negative for ischemia. EF was normal. She was then seen in 12/2016 for palpitations and dizziness. Echo and 30-Day Event Monitor were ordered for further evaluation. Echo showed LVEF of 60-65 with normal wall motion and grade 1 diastolic dysfunction. Monitor showed occasional sinus tachycardia and PACs but no other arrhythmias. Previously on Metoprolol but this was stopped due to significant fatigue. She was last seen by Dr. Delton See in 06/2019 at which time she continued to report occasional palpitations mostly at night before she goes to bed but no other cardiac symptoms. She also described some vertigo symptoms which she has a history of.  Since last office visit, she was admitted from 07/20/2019 to 07/22/2019 for possible TIA after presenting with headache and right facial numbness and weakness. Head CT showed chronic small vessel disease but no acute abnormalities. Brain MRI and head MRA were negative. Echo showed LVEF of 60-65% with normal wall motion and no significant valvular disease.  Carotid dopplers showed only minimal plaque. Neurology was consulted and recommended dual antiplatelet therapy with Aspirin and Plavix for 3 weeks and then Aspirin alone.  Patient presents today for pre-op evaluation for upcoming colonoscopy. Patient doing very well from a cardiac standpoint. No chest pain, shortness of breath, palpitations, syncope, orthopnea, PND, or edema. She thinks she may have had a little blood on the toilet paper recently but no other abnormal bleeding in urine or stools. She stays active and is easily able to complete >4.0 METS.  BP slightly elevated today at 138/88. Patient not on any antihypertensive at home. She states she has been on some in the past but they caused her BP to drop to low to the point where she was unsteady on her feet. She has not been on any medications for this in several years.  Past Medical History:  Diagnosis Date  . Asthma   . Colon polyps   . GERD (gastroesophageal reflux disease)   . Hypertension   . Thyroid nodule    dx 2009    Past Surgical History:  Procedure Laterality Date  . ABDOMINAL HYSTERECTOMY    . CESAREAN SECTION    . THYROIDECTOMY, PARTIAL      Current Medications: Current Meds  Medication Sig  . acetaminophen (TYLENOL) 325 MG tablet Take 650 mg by mouth every 6 (six) hours as needed for moderate pain or headache.   . albuterol (VENTOLIN HFA) 108 (90 Base) MCG/ACT inhaler ONHALE 2 PUFFS BY MOUTH EVERY 4 HOURS AS NEEDED FOR FOR TROUBLE BREATHING (Patient taking differently: Inhale 2 puffs into the lungs every 4 (four) hours as  needed (Trouble breathing). )  . aspirin EC 81 MG EC tablet Take 1 tablet (81 mg total) by mouth daily.  Marland Kitchen atorvastatin (LIPITOR) 40 MG tablet Take 1 tablet (40 mg total) by mouth daily.  . beclomethasone (QVAR REDIHALER) 40 MCG/ACT inhaler Inhale 2 puffs into the lungs 2 (two) times daily. (Patient taking differently: Inhale 2 puffs into the lungs at bedtime. )  . Cholecalciferol (VITAMIN D3 PO)  Take 1 capsule by mouth daily.     Allergies:   Iodine, Shellfish allergy, and Versed [midazolam]   Social History   Socioeconomic History  . Marital status: Married    Spouse name: Not on file  . Number of children: 3  . Years of education: Not on file  . Highest education level: Not on file  Occupational History  . Not on file  Tobacco Use  . Smoking status: Never Smoker  . Smokeless tobacco: Never Used  Vaping Use  . Vaping Use: Never used  Substance and Sexual Activity  . Alcohol use: No  . Drug use: No  . Sexual activity: Not on file  Other Topics Concern  . Not on file  Social History Narrative  . Not on file   Social Determinants of Health   Financial Resource Strain:   . Difficulty of Paying Living Expenses:   Food Insecurity:   . Worried About Programme researcher, broadcasting/film/video in the Last Year:   . Barista in the Last Year:   Transportation Needs:   . Freight forwarder (Medical):   Marland Kitchen Lack of Transportation (Non-Medical):   Physical Activity:   . Days of Exercise per Week:   . Minutes of Exercise per Session:   Stress:   . Feeling of Stress :   Social Connections:   . Frequency of Communication with Friends and Family:   . Frequency of Social Gatherings with Friends and Family:   . Attends Religious Services:   . Active Member of Clubs or Organizations:   . Attends Banker Meetings:   Marland Kitchen Marital Status:      Family History: The patient's family history includes Diabetes in her mother, sister, and sister; Heart disease in her mother; Hypertension in her mother and sister.  ROS:   Please see the history of present illness.    All other systems reviewed and are negative.  EKGs/Labs/Other Studies Reviewed:    The following studies were reviewed today:  Myoview 12/2013: The Myoview stress test was normal. No evidence of old heart attack.No evidence of ischemia. The ejection fraction was normal. _______________  Echocardiogram  01/13/2017: Study Conclusions: - Left ventricle: The cavity size was normal. Wall thickness was  normal. Systolic function was normal. The estimated ejection  fraction was in the range of 60% to 65%. Wall motion was normal;  there were no regional wall motion abnormalities. Doppler  parameters are consistent with abnormal left ventricular  relaxation (grade 1 diastolic dysfunction).  - Aortic valve: There was no stenosis.  - Mitral valve: There was trivial regurgitation.  - Right ventricle: The cavity size was normal. Systolic function  was normal.  - Tricuspid valve: Peak RV-RA gradient (S): 21 mm Hg.  - Pulmonary arteries: PA peak pressure: 24 mm Hg (S).  - Inferior vena cava: The vessel was normal in size. The  respirophasic diameter changes were in the normal range (= 50%),  consistent with normal central venous pressure.   Impressions: - Normal LV size and systolic function, EF  60-65%. Normal RV size  and systolic function. No significant valvular abnormalities. _______________  Monitor  EKG:  EKG not ordered today. Recent EKG on normal sinus rhythm, rate 67 bpm, with no acute ST/T changes. Normal axis. Normal PR and QRS interval. QTc 396 ms.  Recent Labs: 07/20/2019: ALT 33 07/21/2019: BUN 10; Creatinine, Ser 0.70; Hemoglobin 12.9; Platelets 218; Potassium 3.9; Sodium 139  Recent Lipid Panel    Component Value Date/Time   CHOL 108 08/22/2019 1010   TRIG 40 08/22/2019 1010   HDL 60 08/22/2019 1010   CHOLHDL 1.8 08/22/2019 1010   CHOLHDL 2.9 07/21/2019 0445   VLDL 15 07/21/2019 0445   LDLCALC 37 08/22/2019 1010    Physical Exam:    Vital Signs: BP 138/88   Pulse 74   Ht 5\' 7"  (1.702 m)   Wt 221 lb 3.2 oz (100.3 kg)   SpO2 98%   BMI 34.64 kg/m     Wt Readings from Last 3 Encounters:  08/31/19 221 lb 3.2 oz (100.3 kg)  08/22/19 221 lb (100.2 kg)  07/20/19 222 lb (100.7 kg)     General: 64 y.o. female in no acute distress. HEENT: Normocephalic  and atraumatic. Sclera clear.  Neck: Supple. No carotid bruits.  Heart: RRR. Distinct S1 and S2. No murmurs, gallops, or rubs. Radial and distal pedal pulses 2+ and equal bilaterally. Lungs: No increased work of breathing. Clear to ausculation bilaterally. No wheezes, rhonchi, or rales.  Abdomen: Soft, non-distended, and non-tender to palpation.  Extremities: No lower extremity edema. Prominent leg veins but non-tender.  Skin: Warm and dry. Neuro: Alert and oriented x3. No focal deficits. Psych: Normal affect. Responds appropriately.  Assessment:    1. Pre-op evaluation   2. Essential hypertension   3. Hyperlipidemia, unspecified hyperlipidemia type   4. Palpitations     Plan:    Pre-Op Evaluation - Patient scheduled for colonoscopy on 09/04/2019.  - Recent EKG on 07/20/2019 showed no acute ischemic changes. Patient was worried that her insurance would not pay for another EKG so did not repeat. - No cardiac symptoms. - Per Revised Cardiac Risk Index, considered low risk. Able to complete >4.0 METS. Therefore, per ACC/AHA guidelines, patient would be at acceptable risk for the planned procedure without further cardiovascular testing. Given recent TIA, would recommend patient continue Aspirin throughout peri-operative period. If this needs to be held, would defer to Neurology. I will route this recommendation to the requesting party via Epic fax function.  Hypertension - BP slightly elevated.  - Previously on antihypertensives but states this caused her BP to drop too low to the point that she was very unsteady on her feet. She is hesitant to restart medication. - Advised limiting salt intake. - Patient to keep log of BP for 2-3 weeks and then send 07/22/2019 results. If only mildly elevated like today, OK with not starting any medications.  Hyperlipidemia - Recent lipid panel on 08/22/2019: Total Cholesterol 108, Triglycerides 40, HDL 60, LDL 37.  - LDL goal <70 given recent TIA. - Continue  Lipitor 40mg  daily.   Palpitations - Monitor in 12/2016 showed occasional sinus tachycardia and PACs but no other arrhythmias.  - Unable to tolerate Metoprolol in the past.  - No recent problems.    Disposition: Follow up in 6 months with Dr. .   Medication Adjustments/Labs and Tests Ordered: Current medicines are reviewed at length with the patient today.  Concerns regarding medicines are outlined above.  No orders of the defined types  were placed in this encounter.  No orders of the defined types were placed in this encounter.   Patient Instructions  Medication Instructions:  Your physician recommends that you continue on your current medications as directed. Please refer to the Current Medication list given to you today.  *If you need a refill on your cardiac medications before your next appointment, please call your pharmacy*  Follow-Up: At [facility], you and your health needs are our priority.  As part of our continuing mission to provide you with exceptional heart care, we have created designated Provider Care Teams.  These Care Teams include your primary Cardiologist (physician) and Advanced Practice Providers (APPs -  Physician Assistants and Nurse Practitioners) who all work together to provide you with the care you need, when you need it.  We recommend signing up for the patient portal called "MyChart".  Sign up information is provided on this After Visit Summary.  MyChart is used to connect with patients for Virtual Visits (Telemedicine).  Patients are able to view lab/test results, encounter notes, upcoming appointments, etc.  Non-urgent messages can be sent to your provider as well.   To learn more about what you can do with MyChart, go to [email or web address].    Your next appointment:   6 month(s)  The format for your next appointment:   In Person  Provider:   You may see Ena Dawley, MD or one of the following Advanced Practice Providers on your  designated Care Team:    Melina Copa, PA-C  Ermalinda Barrios, PA-C    Other Instructions  Please check BP at home for 2-3 weeks and call with readings or send via [place], Darreld Mclean, PA-C  08/31/2019 9:30 AM    [place]

## 2019-08-20 NOTE — Telephone Encounter (Signed)
Primary Cardiologist:Katarina Delton See, MD  Chart reviewed as part of pre-operative protocol coverage. Because of Kerri Johnson [facility] past medical history and time since last visit, he/she will require a follow-up visit in order to better assess preoperative cardiovascular risk.  Pre-op covering staff: - Please schedule appointment and call patient to inform them. - Please contact requesting surgeon's office via preferred method (i.e, phone, fax) to inform them of need for appointment prior to surgery.  If applicable, this message will also be routed to pharmacy pool and/or primary cardiologist for input on holding anticoagulant/antiplatelet agent as requested below so that this information is available at time of patient's appointment.   Ronney Asters, NP  08/20/2019, 3:58 PM

## 2019-08-20 NOTE — Telephone Encounter (Signed)
   [facility] Pre-operative Risk Assessment    HEARTCARE STAFF: - Please ensure there is not already an duplicate clearance open for this procedure. - Under Visit Info/Reason for Call, type in Other and utilize the format Clearance MM/DD/YY or Clearance TBD. Do not use dashes or single digits. - If request is for dental extraction, please clarify the # of teeth to be extracted.  Request for surgical clearance:  1. What type of surgery is being performed? COLONOSCOPY   2. When is this surgery scheduled? 09/04/19   3. What type of clearance is required (medical clearance vs. Pharmacy clearance to hold med vs. Both)? BOTH  4. Are there any medications that need to be held prior to surgery and how long? ASA PLEASE ADVISE   5. Practice name and name of physician performing surgery? [facility]; DR. MAGOD   6. What is the office phone number? [phone]   7.   What is the office fax number? [phone]  8.   Anesthesia type (None, local, MAC, general) ? NONE LISTED   Jacinta Shoe 08/20/2019, 3:46 PM  _________________________________________________________________   (provider comments below)

## 2019-08-20 NOTE — Telephone Encounter (Signed)
Appt scheduled for cardiac clearance6/25/2021 9:00 AM w/Goodrich, Callie PA-C

## 2019-08-22 ENCOUNTER — Other Ambulatory Visit: Payer: Self-pay

## 2019-08-22 ENCOUNTER — Encounter: Payer: Self-pay | Admitting: Adult Health

## 2019-08-22 ENCOUNTER — Ambulatory Visit: Payer: BC Managed Care – PPO | Admitting: Adult Health

## 2019-08-22 VITALS — BP 132/79 | HR 67 | Ht 67.0 in | Wt 221.0 lb

## 2019-08-22 DIAGNOSIS — G459 Transient cerebral ischemic attack, unspecified: Secondary | ICD-10-CM

## 2019-08-22 DIAGNOSIS — I10 Essential (primary) hypertension: Secondary | ICD-10-CM

## 2019-08-22 DIAGNOSIS — E785 Hyperlipidemia, unspecified: Secondary | ICD-10-CM | POA: Diagnosis not present

## 2019-08-22 NOTE — Patient Instructions (Addendum)
Continue aspirin 81 mg daily  and atorvastatin 40 mg daily for secondary stroke prevention  Check cholesterol level today to ensure satisfactory management with use of statin  Continue to follow up with PCP regarding cholesterol and blood pressure management   Continue to monitor blood pressure at home  Maintain strict control of hypertension with blood pressure goal below 130/90, diabetes with hemoglobin A1c goal below 6.5% and cholesterol with LDL cholesterol (bad cholesterol) goal below 70 mg/dL. I also advised the patient to eat a healthy diet with plenty of whole grains, cereals, fruits and vegetables, exercise regularly and maintain ideal body weight.  Followup in the future with me in 4 months or call earlier if needed       Thank you for coming to see [place] at [facility]. I hope we have been able to provide you high quality care today.  You may receive a patient satisfaction survey over the next few weeks. We would appreciate your feedback and comments so that we may continue to improve ourselves and the health of our patients.    Stroke Prevention Some medical conditions and lifestyle choices can lead to a higher risk for a stroke. You can help to prevent a stroke by making nutrition, lifestyle, and other changes. What nutrition changes can be made?   Eat healthy foods. ? Choose foods that are high in fiber. These include:  Fresh fruits.  Fresh vegetables.  Whole grains. ? Eat at least 5 or more servings of fruits and vegetables each day. Try to fill half of your plate at each meal with fruits and vegetables. ? Choose lean protein foods. These include:  Lowfat (lean) cuts of meat.  Chicken without skin.  Fish.  Tofu.  Beans.  Nuts. ? Eat low-fat dairy products. ? Avoid foods that:  Are high in salt (sodium).  Have saturated fat.  Have trans fat.  Have cholesterol.  Are processed.  Are premade.  Follow eating guidelines as told  by your doctor. These may include: ? Reducing how many calories you eat and drink each day. ? Limiting how much salt you eat or drink each day to 1,500 milligrams (mg). ? Using only healthy fats for cooking. These include:  Olive oil.  Canola oil.  Sunflower oil. ? Counting how many carbohydrates you eat and drink each day. What lifestyle changes can be made?  Try to stay at a healthy weight. Talk to your doctor about what a good weight is for you.  Get at least 30 minutes of moderate physical activity at least 5 days a week. This can include: ? Fast walking. ? Biking. ? Swimming.  Do not use any products that have nicotine or tobacco. This includes cigarettes and e-cigarettes. If you need help quitting, ask your doctor. Avoid being around tobacco smoke in general.  Limit how much alcohol you drink to no more than 1 drink a day for nonpregnant women and 2 drinks a day for men. One drink equals 12 oz of beer, 5 oz of wine, or 1 oz of hard liquor.  Do not use drugs.  Avoid taking birth control pills. Talk to your doctor about the risks of taking birth control pills if: ? You are over 68 years old. ? You smoke. ? You get migraines. ? You have had a blood clot. What other changes can be made?  Manage your cholesterol. ? It is important to eat a healthy diet. ? If your cholesterol cannot be managed through your  diet, you may also need to take medicines. Take medicines as told by your doctor.  Manage your diabetes. ? It is important to eat a healthy diet and to exercise regularly. ? If your blood sugar cannot be managed through diet and exercise, you may need to take medicines. Take medicines as told by your doctor.  Control your high blood pressure (hypertension). ? Try to keep your blood pressure below 130/80. This can help lower your risk of stroke. ? It is important to eat a healthy diet and to exercise regularly. ? If your blood pressure cannot be managed through diet and  exercise, you may need to take medicines. Take medicines as told by your doctor. ? Ask your doctor if you should check your blood pressure at home. ? Have your blood pressure checked every year. Do this even if your blood pressure is normal.  Talk to your doctor about getting checked for a sleep disorder. Signs of this can include: ? Snoring a lot. ? Feeling very tired.  Take over-the-counter and prescription medicines only as told by your doctor. These may include aspirin or blood thinners (antiplatelets or anticoagulants).  Make sure that any other medical conditions you have are managed. Where to find more information  American Stroke Association: www.strokeassociation.org  National Stroke Association: www.stroke.org Get help right away if:  You have any symptoms of stroke. "BE FAST" is an easy way to remember the main warning signs: ? B - Balance. Signs are dizziness, sudden trouble walking, or loss of balance. ? E - Eyes. Signs are trouble seeing or a sudden change in how you see. ? F - Face. Signs are sudden weakness or loss of feeling of the face, or the face or eyelid drooping on one side. ? A - Arms. Signs are weakness or loss of feeling in an arm. This happens suddenly and usually on one side of the body. ? S - Speech. Signs are sudden trouble speaking, slurred speech, or trouble understanding what people say. ? T - Time. Time to call emergency services. Write down what time symptoms started.  You have other signs of stroke, such as: ? A sudden, very bad headache with no known cause. ? Feeling sick to your stomach (nausea). ? Throwing up (vomiting). ? Jerky movements you cannot control (seizure). These symptoms may represent a serious problem that is an emergency. Do not wait to see if the symptoms will go away. Get medical help right away. Call your local emergency services (911 in the [place] Do not drive yourself to the hospital. Summary  You can prevent a stroke by  eating healthy, exercising, not smoking, drinking less alcohol, and treating other health problems, such as diabetes, high blood pressure, or high cholesterol.  Do not use any products that contain nicotine or tobacco, such as cigarettes and e-cigarettes.  Get help right away if you have any signs or symptoms of a stroke. This information is not intended to replace advice given to you by your health care provider. Make sure you discuss any questions you have with your health care provider. Document Revised: 04/20/2018 Document Reviewed: 05/26/2016 Elsevier Patient Education  [place].

## 2019-08-22 NOTE — Progress Notes (Signed)
[facility] [address] [place]. [place] [address] [phone]       HOSPITAL FOLLOW UP NOTE  [name] Date of Birth:  29-Nov-1955 Medical Record Number:  [id number]   Reason for Referral:  hospital stroke follow up    SUBJECTIVE:   CHIEF COMPLAINT:  Chief Complaint  Patient presents with  . Follow-up    hospital fu, rm 9, alone     HPI:   [name] is a 64 y.o. female with history of Htn, aura migraines, hx of BPPV, thyroid nodule and asthma  who presented on 07/20/2019 to [facility] after awakening with right-sided (lips and shoulder) numbness, right facial droop and RUE weakness with headache the night prior likely due to possible TIA vs complex migraine and stroke work-up largely unremarkable without evidence of acute stroke.  MRA and carotid Doppler unremarkable.  Recommended DAPT for 3 weeks and aspirin alone.  HTN stable not currently on antihypertensives with long-term BP low normotensive range.  LDL 103 initiate atorvastatin 40 mg daily.  No history of DM.  Other stroke risk factors include aura migraines, advanced age, obesity and patient reported prior TIA.  Complaints of dizziness during admission likely BPPV receiving Epley's maneuver by PT with improvement as well as meclizine as needed.  Evaluated by therapy and recommended home health therapy for ambulatory dysfunction but patient declined and was discharged home in stable condition.  Possible TIA    CT head - Chronic small vessel disease throughout the deep white matter. No acute intracranial abnormality.   MRI head - Negative MRI head. Prominent perivascular spaces.   MRA head - Normal  Carotid Doppler - essentially normal  2D Echo EF 60-65%  [employer] Virus 2 - negative  LDL - 103 -initiate atorvastatin  HgbA1c - 5.7  UDS - negative  VTE prophylaxis - Lovenox  No antithrombotic prior to admission, now on aspirin 81 mg daily and clopidogrel 75 mg  daily.  Continue DAPT for 3 weeks and then aspirin alone.  Patient counseled to be compliant with her antithrombotic medications  Ongoing aggressive stroke risk factor management  Therapy recommendations: Home PT/OT  Disposition:  home  Today, 08/22/2019, [name] is being seen for hospital follow-up.  She has been doing well since discharge but reports residual right hand weakness. She is able to paint and write without difficulty but reports difficulty opening jars. No residual numbness/tingling or facial symptoms. Denies new stroke/TIA symptoms. Completed 3 weeks DAPT and continues on aspirin alone without bleeding or bruising.  Continues on atorvastatin 40 mg daily without myalgias.  Blood pressure today 132/79.  No concerns at this time.      ROS:   14 system review of systems performed and negative with exception of weakness  PMH:  Past Medical History:  Diagnosis Date  . Asthma   . Colon polyps   . GERD (gastroesophageal reflux disease)   . Hypertension   . Thyroid nodule    dx 2009    PSH:  Past Surgical History:  Procedure Laterality Date  . ABDOMINAL HYSTERECTOMY    . CESAREAN SECTION    . THYROIDECTOMY, PARTIAL      Social History:  Social History   Socioeconomic History  . Marital status: Married    Spouse name: Not on file  . Number of children: 3  . Years of education: Not on file  . Highest education level: Not on file  Occupational History  . Not on file  Tobacco Use  . Smoking status: Never Smoker  . Smokeless tobacco: Never Used  Vaping Use  . Vaping Use: Never used  Substance and Sexual Activity  . Alcohol use: No  . Drug use: No  . Sexual activity: Not on file  Other Topics Concern  . Not on file  Social History Narrative  . Not on file   Social Determinants of Health   Financial Resource Strain:   . Difficulty of Paying Living Expenses:   Food Insecurity:   . Worried About Programme researcher, broadcasting/film/video in the Last Year:   . Engineer, site in the Last Year:   Transportation Needs:   . Freight forwarder (Medical):   Marland Kitchen Lack of Transportation (Non-Medical):   Physical Activity:   . Days of Exercise per Week:   . Minutes of Exercise per Session:   Stress:   . Feeling of Stress :   Social Connections:   . Frequency of Communication with Friends and Family:   . Frequency of Social Gatherings with Friends and Family:   . Attends Religious Services:   . Active Member of Clubs or Organizations:   . Attends Banker Meetings:   Marland Kitchen Marital Status:   Intimate Partner Violence:   . Fear of Current or Ex-Partner:   . Emotionally Abused:   Marland Kitchen Physically Abused:   . Sexually Abused:     Family History:  Family History  Problem Relation Age of Onset  . Diabetes Mother   . Heart disease Mother   . Hypertension Mother   . Diabetes Sister   . Hypertension Sister   . Diabetes Sister     Medications:   Current Outpatient Medications on File Prior to Visit  Medication Sig Dispense Refill  . acetaminophen (TYLENOL) 325 MG tablet Take 650 mg by mouth every 6 (six) hours as needed for moderate pain or headache.     . albuterol (VENTOLIN HFA) 108 (90 Base) MCG/ACT inhaler ONHALE 2 PUFFS BY MOUTH EVERY 4 HOURS AS NEEDED FOR FOR TROUBLE BREATHING (Patient taking differently: Inhale 2 puffs into the lungs every 4 (four) hours as needed (Trouble breathing). ) 18 g 2  . aspirin EC 81 MG EC tablet Take 1 tablet (81 mg total) by mouth daily. 360 tablet 0  . atorvastatin (LIPITOR) 40 MG tablet Take 1 tablet (40 mg total) by mouth daily. 90 tablet 0  . beclomethasone (QVAR REDIHALER) 40 MCG/ACT inhaler Inhale 2 puffs into the lungs 2 (two) times daily. (Patient taking differently: Inhale 2 puffs into the lungs at bedtime. ) 10.6 g 11  . Cholecalciferol (VITAMIN D3 PO) Take 1 capsule by mouth daily.     No current facility-administered medications on file prior to visit.    Allergies:   Allergies  Allergen Reactions  .  Iodine Anaphylaxis  . Shellfish Allergy Anaphylaxis  . Versed [Midazolam] Other (See Comments)    Effects memory      OBJECTIVE:  Physical Exam  Vitals:   08/22/19 0923  BP: 132/79  Pulse: 67  Weight: 221 lb (100.2 kg)  Height: 5\' 7"  (1.702 m)   Body mass index is 34.61 kg/m. No exam data present   General: well developed, well nourished,  pleasant middle-aged African-American female, seated, in no evident distress Head: head normocephalic and atraumatic.   Neck: supple with no carotid or supraclavicular bruits Cardiovascular: regular rate and rhythm, no murmurs Musculoskeletal: no deformity Skin:  no rash/petichiae Vascular:  Normal pulses  all extremities   Neurologic Exam Mental Status: Awake and fully alert.   Fluent speech and language.  Oriented to place and time. Recent and remote memory intact. Attention span, concentration and fund of knowledge appropriate. Mood and affect appropriate.  Cranial Nerves: Fundoscopic exam reveals sharp disc margins. Pupils equal, briskly reactive to light. Extraocular movements full without nystagmus. Visual fields full to confrontation. Hearing intact. Facial sensation intact. Face, tongue, palate moves normally and symmetrically.  Motor: Normal bulk and tone. Normal strength in all tested extremity muscles. Sensory.: intact to touch , pinprick , position and vibratory sensation.  Coordination: Rapid alternating movements normal in all extremities. Finger-to-nose and heel-to-shin performed accurately bilaterally. Gait and Station: Arises from chair without difficulty. Stance is normal. Gait demonstrates normal stride length and balance Reflexes: 1+ and symmetric. Toes downgoing.     NIHSS  0 Modified Rankin  2     ASSESSMENT: Kerri Johnson is a 64 y.o. year old female presented with right-sided numbness/weakness on 07/20/2019 likely due to TIA. Vascular risk factors include HTN, HLD and chronic small vessel disease.   Subjective residual right hand weakness but unable to appreciate on exam.     PLAN:  1. TIA: Continue aspirin 81 mg daily  and atorvastatin 40 mg daily for secondary stroke prevention. Maintain strict control of hypertension with blood pressure goal below 130/90, diabetes with hemoglobin A1c goal below 6.5% and cholesterol with LDL cholesterol (bad cholesterol) goal below 70 mg/dL.  I also advised the patient to eat a healthy diet with plenty of whole grains, cereals, fruits and vegetables, exercise regularly with at least 30 minutes of continuous activity daily and maintain ideal body weight. 2. HTN: Stable.  Continue to follow with PCP for monitoring management 3. HLD: Does not have follow-up scheduled PCP in the near future therefore will obtain lipid panel at today's visit.  Continuation of atorvastatin 40 mg daily and continue to follow with PCP for prescribing, monitoring and management    Follow up in 4 months or call earlier if needed  CC: [facility] provider Dr. Woodward Ku, Soyla Murphy, MD    I spent 45 minutes of face-to-face and non-face-to-face time with patient.  This included previsit chart review, lab review, study review, order entry, electronic health record documentation, patient education regarding recent stroke,  importance of managing stroke risk factors and answered all questions to patient satisfaction     Ihor Austin, [facility]  [facility] [address] [place], [place] [address]  Phone [phone] Fax [phone] Note: This document was prepared with digital dictation and possible smart phrase technology. Any transcriptional errors that result from this process are unintentional.

## 2019-08-23 LAB — LIPID PANEL
Chol/HDL Ratio: 1.8 ratio (ref 0.0–4.4)
Cholesterol, Total: 108 mg/dL (ref 100–199)
HDL: 60 mg/dL (ref >39)
LDL Chol Calc (NIH): 37 mg/dL (ref 0–99)
Triglycerides: 40 mg/dL (ref 0–149)
VLDL Cholesterol Cal: 11 mg/dL (ref 5–40)

## 2019-08-24 NOTE — Progress Notes (Signed)
I agree with the above plan 

## 2019-08-27 ENCOUNTER — Telehealth: Payer: Self-pay

## 2019-08-27 NOTE — Telephone Encounter (Addendum)
Attempted to call the patient without success.  LM on the VM for the patient to call back re: recent results.  **If the patient calls back please relay the message below from Ihor Austin, NP  ----- Message from Ihor Austin, NP sent at 08/23/2019 12:00 PM EDT ----- Please advise patient that recent lipid panel showed satisfactory LDL at 37 which showed great improvement after initiating atorvastatin and recommend continued current management

## 2019-08-29 NOTE — Telephone Encounter (Signed)
Attempted to call pt, LVM for labresults per DPR. Ask pt to call back for questions or concerns. 

## 2019-08-31 ENCOUNTER — Other Ambulatory Visit: Payer: Self-pay

## 2019-08-31 ENCOUNTER — Ambulatory Visit: Payer: BC Managed Care – PPO | Admitting: Student

## 2019-08-31 ENCOUNTER — Encounter: Payer: Self-pay | Admitting: Student

## 2019-08-31 VITALS — BP 138/88 | HR 74 | Ht 67.0 in | Wt 221.2 lb

## 2019-08-31 DIAGNOSIS — I10 Essential (primary) hypertension: Secondary | ICD-10-CM

## 2019-08-31 DIAGNOSIS — R002 Palpitations: Secondary | ICD-10-CM | POA: Diagnosis not present

## 2019-08-31 DIAGNOSIS — E785 Hyperlipidemia, unspecified: Secondary | ICD-10-CM

## 2019-08-31 DIAGNOSIS — Z01818 Encounter for other preprocedural examination: Secondary | ICD-10-CM | POA: Diagnosis not present

## 2019-08-31 NOTE — Patient Instructions (Signed)
Medication Instructions:  Your physician recommends that you continue on your current medications as directed. Please refer to the Current Medication list given to you today.  *If you need a refill on your cardiac medications before your next appointment, please call your pharmacy*  Follow-Up: At [facility], you and your health needs are our priority.  As part of our continuing mission to provide you with exceptional heart care, we have created designated Provider Care Teams.  These Care Teams include your primary Cardiologist (physician) and Advanced Practice Providers (APPs -  Physician Assistants and Nurse Practitioners) who all work together to provide you with the care you need, when you need it.  We recommend signing up for the patient portal called "MyChart".  Sign up information is provided on this After Visit Summary.  MyChart is used to connect with patients for Virtual Visits (Telemedicine).  Patients are able to view lab/test results, encounter notes, upcoming appointments, etc.  Non-urgent messages can be sent to your provider as well.   To learn more about what you can do with MyChart, go to [email or web address].    Your next appointment:   6 month(s)  The format for your next appointment:   In Person  Provider:   You may see Tobias Alexander, MD or one of the following Advanced Practice Providers on your designated Care Team:    Ronie Spies, PA-C  Jacolyn Reedy, PA-C    Other Instructions  Please check BP at home for 2-3 weeks and call with readings or send via MyChart

## 2019-09-03 DIAGNOSIS — Z1159 Encounter for screening for other viral diseases: Secondary | ICD-10-CM | POA: Diagnosis not present

## 2019-09-04 DIAGNOSIS — Z8371 Family history of colonic polyps: Secondary | ICD-10-CM | POA: Diagnosis not present

## 2019-09-04 DIAGNOSIS — K621 Rectal polyp: Secondary | ICD-10-CM | POA: Diagnosis not present

## 2019-09-04 DIAGNOSIS — Z86010 Personal history of colonic polyps: Secondary | ICD-10-CM | POA: Diagnosis not present

## 2019-09-04 DIAGNOSIS — K635 Polyp of colon: Secondary | ICD-10-CM | POA: Diagnosis not present

## 2019-09-28 ENCOUNTER — Other Ambulatory Visit: Payer: Self-pay | Admitting: Internal Medicine

## 2019-12-24 ENCOUNTER — Ambulatory Visit: Payer: BC Managed Care – PPO | Admitting: Adult Health

## 2019-12-27 ENCOUNTER — Encounter: Payer: Self-pay | Admitting: Adult Health

## 2019-12-27 ENCOUNTER — Other Ambulatory Visit: Payer: Self-pay

## 2019-12-27 ENCOUNTER — Ambulatory Visit: Payer: BC Managed Care – PPO | Admitting: Adult Health

## 2019-12-27 VITALS — BP 116/76 | HR 69 | Ht 67.0 in | Wt 225.0 lb

## 2019-12-27 DIAGNOSIS — I10 Essential (primary) hypertension: Secondary | ICD-10-CM | POA: Diagnosis not present

## 2019-12-27 DIAGNOSIS — G459 Transient cerebral ischemic attack, unspecified: Secondary | ICD-10-CM | POA: Diagnosis not present

## 2019-12-27 DIAGNOSIS — E785 Hyperlipidemia, unspecified: Secondary | ICD-10-CM

## 2019-12-27 MED ORDER — ATORVASTATIN CALCIUM 40 MG PO TABS: 40.0000 mg | ORAL_TABLET | Freq: Every day | ORAL | 3 refills | Status: AC

## 2019-12-27 NOTE — Patient Instructions (Signed)
Continue aspirin 81 mg daily  and atorvastatin 40 mg daily for secondary stroke prevention  Continue to follow up with PCP regarding blood pressure and cholesterol management  Maintain strict control of hypertension with blood pressure goal below 130/90 and cholesterol with LDL cholesterol (bad cholesterol) goal below 70 mg/dL.     Followup in the future with me in 6 months or call earlier if needed     Thank you for coming to see [place] at [facility]. I hope we have been able to provide you high quality care today.  You may receive a patient satisfaction survey over the next few weeks. We would appreciate your feedback and comments so that we may continue to improve ourselves and the health of our patients.

## 2019-12-27 NOTE — Progress Notes (Signed)
[facility] [address] [place]. [place] [address] [phone]       STROKE FOLLOW UP NOTE  Ms. Kathlee Nations Date of Birth:  09/03/1955 Medical Record Number:  [id number]   Reason for Referral: stroke follow up    SUBJECTIVE:   CHIEF COMPLAINT:  Chief Complaint  Patient presents with  . Follow-up    rm 9  . Transient Ischemic Attack    Pt is having no new sx. Pt still has right side facial tingling.    HPI:   Today, 12/27/2019, Ms. Purkey returns for TIA follow-up.  She reports continued intermittent right lower facial tingling and continued decreased right hand fine motor weakness such as difficulty opening a jar.  Denies new or worsening stroke/TIA symptoms.  Remains on aspirin 81 mg daily without bleeding or bruising.  Previously on atorvastatin 40 mg daily but she ran out of prescription around August and has not been able to get a refill.  Blood pressure today 116/76.  No concerns at this time.    History provided for reference purposes only Update 08/22/2019 JM: Ms. Mundo is being seen for hospital follow-up.  She has been doing well since discharge but reports residual right hand weakness. She is able to paint and write without difficulty but reports difficulty opening jars. No residual numbness/tingling or facial symptoms. Denies new stroke/TIA symptoms. Completed 3 weeks DAPT and continues on aspirin alone without bleeding or bruising.  Continues on atorvastatin 40 mg daily without myalgias.  Blood pressure today 132/79.  No concerns at this time.  Stroke admission 07/20/2019 Ms. Vivan Vanderveer is a 64 y.o. female with history of Htn, aura migraines, hx of BPPV, thyroid nodule and asthma  who [facility] on 07/20/[facility] to [facility] after awakening with right-sided (lips and shoulder) numbness, right facial droop and RUE weakness with headache the night prior likely due to possible TIA vs complex migraine and stroke work-up largely unremarkable  without evidence of acute stroke.  MRA and carotid Doppler unremarkable.  Recommended DAPT for 3 weeks and aspirin alone.  HTN stable not currently on antihypertensives with long-term BP low normotensive range.  LDL 103 initiate atorvastatin 40 mg daily.  No history of DM.  Other stroke risk factors include aura migraines, advanced age, obesity and patient reported prior TIA.  Complaints of dizziness during admission likely BPPV receiving Epley's maneuver by PT with improvement as well as meclizine as needed.  Evaluated by therapy and recommended home health therapy for ambulatory dysfunction but patient declined and was discharged home in stable condition.  Possible TIA    CT head - Chronic small vessel disease throughout the deep white matter. No acute intracranial abnormality.   MRI head - Negative MRI head. Prominent perivascular spaces.   MRA head - Normal  Carotid Doppler - essentially normal  2D Echo EF 60-65%  [employer] Virus 2 - negative  LDL - 103 -initiate atorvastatin  HgbA1c - 5.7  UDS - negative  VTE prophylaxis - Lovenox  No antithrombotic prior to admission, now on aspirin 81 mg daily and clopidogrel 75 mg daily.  Continue DAPT for 3 weeks and then aspirin alone.  Patient counseled to be compliant with her antithrombotic medications  Ongoing aggressive stroke risk factor management  Therapy recommendations: Home PT/OT  Disposition:  home     ROS:   14 system review of systems performed and negative with exception of weakness and tingling  PMH:  Past Medical History:  Diagnosis Date  .  Asthma   . Colon polyps   . GERD (gastroesophageal reflux disease)   . Hypertension   . Thyroid nodule    dx 2009    PSH:  Past Surgical History:  Procedure Laterality Date  . ABDOMINAL HYSTERECTOMY    . CESAREAN SECTION    . THYROIDECTOMY, PARTIAL      Social History:  Social History   Socioeconomic History  . Marital status: Married    Spouse name: Not  on file  . Number of children: 3  . Years of education: Not on file  . Highest education level: Not on file  Occupational History  . Not on file  Tobacco Use  . Smoking status: Never Smoker  . Smokeless tobacco: Never Used  Vaping Use  . Vaping Use: Never used  Substance and Sexual Activity  . Alcohol use: No  . Drug use: No  . Sexual activity: Not on file  Other Topics Concern  . Not on file  Social History Narrative  . Not on file   Social Determinants of Health   Financial Resource Strain:   . Difficulty of Paying Living Expenses: Not on file  Food Insecurity:   . Worried About Programme researcher, broadcasting/film/video in the Last Year: Not on file  . Ran Out of Food in the Last Year: Not on file  Transportation Needs:   . Lack of Transportation (Medical): Not on file  . Lack of Transportation (Non-Medical): Not on file  Physical Activity:   . Days of Exercise per Week: Not on file  . Minutes of Exercise per Session: Not on file  Stress:   . Feeling of Stress : Not on file  Social Connections:   . Frequency of Communication with Friends and Family: Not on file  . Frequency of Social Gatherings with Friends and Family: Not on file  . Attends Religious Services: Not on file  . Active Member of Clubs or Organizations: Not on file  . Attends Banker Meetings: Not on file  . Marital Status: Not on file  Intimate Partner Violence:   . Fear of Current or Ex-Partner: Not on file  . Emotionally Abused: Not on file  . Physically Abused: Not on file  . Sexually Abused: Not on file    Family History:  Family History  Problem Relation Age of Onset  . Diabetes Mother   . Heart disease Mother   . Hypertension Mother   . Diabetes Sister   . Hypertension Sister   . Diabetes Sister     Medications:   Current Outpatient Medications on File Prior to Visit  Medication Sig Dispense Refill  . acetaminophen (TYLENOL) 325 MG tablet Take 650 mg by mouth every 6 (six) hours as needed  for moderate pain or headache.     . albuterol (VENTOLIN HFA) 108 (90 Base) MCG/ACT inhaler ONHALE 2 PUFFS BY MOUTH EVERY 4 HOURS AS NEEDED FOR FOR TROUBLE BREATHING (Patient taking differently: Inhale 2 puffs into the lungs every 4 (four) hours as needed (Trouble breathing). ) 18 g 2  . aspirin EC 81 MG EC tablet Take 1 tablet (81 mg total) by mouth daily. 360 tablet 0  . QVAR REDIHALER 40 MCG/ACT inhaler INHALE 2 PUFFS INTO THE LUNGS TWICE DAILY 10.6 g 5   No current facility-administered medications on file prior to visit.    Allergies:   Allergies  Allergen Reactions  . Iodine Anaphylaxis  . Shellfish Allergy Anaphylaxis  . Versed [Midazolam] Other (See  Comments)    Effects memory      OBJECTIVE:  Physical Exam  Vitals:   12/27/19 0824  BP: 116/76  Pulse: 69  Weight: 225 lb (102.1 kg)  Height: 5\' 7"  (1.702 m)   Body mass index is 35.24 kg/m. No exam data present   General: well developed, well nourished,  pleasant middle-aged African-American female, seated, in no evident distress Head: head normocephalic and atraumatic.   Neck: supple with no carotid or supraclavicular bruits Cardiovascular: regular rate and rhythm, no murmurs Musculoskeletal: no deformity Skin:  no rash/petichiae Vascular:  Normal pulses all extremities   Neurologic Exam Mental Status: Awake and fully alert. Fluent speech and language. Oriented to place and time. Recent and remote memory intact. Attention span, concentration and fund of knowledge appropriate. Mood and affect appropriate.  Cranial Nerves: Pupils equal, briskly reactive to light. Extraocular movements full without nystagmus. Visual fields full to confrontation. Hearing intact.  Right lower facial sensation subjectively decreased to light touch and pinprick.  Mild right nasolabial fold flattening.  Tongue and palate moves normally and symmetrically. Right lower facial weakness Motor: Normal bulk and tone. Normal strength in all tested  extremity muscles except very slight decreased right hand fine motor weakness Sensory.: intact to touch , pinprick , position and vibratory sensation.  Coordination: Rapid alternating movements normal in all extremities. Finger-to-nose and heel-to-shin performed accurately bilaterally. Gait and Station: Arises from chair without difficulty. Stance is normal. Gait demonstrates normal stride length and balance Reflexes: 1+ and symmetric. Toes downgoing.        ASSESSMENT/PLAN: Mayley Lish is a 64 y.o. year old female presented with right-sided numbness/weakness on 07/20/2019 likely due to TIA. Vascular risk factors include HTN, HLD and chronic small vessel disease.     1. TIA:  a. Residual very mild decreased right hand fine motor weakness, mild right nasolabial fold flattening and intermittent right lower facial tingling which is all been stable without worsening. b. Continue aspirin 81 mg daily  and atorvastatin 40 mg daily for secondary stroke prevention.   c. Discussed secondary stroke prevention measures and importance of close PCP follow-up for aggressive stroke risk factor management 2. HTN: BP goal<130/90.  Stable today. Currently diet controlled.  Monitored and managed by PCP. 3. HLD: LDL goal<70.  LDL 37 on 08/22/2019.  Ran out of her prescription 2 months ago and advised to restart atorvastatin 40mg  daily with refill provided - request ongoing refills and monitoring by PCP    Follow up in 6 months or call earlier if needed    CC: [facility] provider Dr. 08/24/2019, , MD    I spent 25 minutes of face-to-face and non-face-to-face time with patient.  This included previsit chart review, lab review, study review, order entry, electronic health record documentation, patient education regarding recent TIA and residual deficits,  importance of managing stroke risk factors and answered all questions to patient satisfaction   Woodward Ku, [facility]  [facility] [address] [place], [address] [place]  Phone [phone] Fax [phone] Note: This document was prepared with digital dictation and possible smart phrase technology. Any transcriptional errors that result from this process are unintentional.

## 2019-12-28 NOTE — Progress Notes (Signed)
I agree with the above plan 

## 2020-01-18 DIAGNOSIS — R1313 Dysphagia, pharyngeal phase: Secondary | ICD-10-CM | POA: Diagnosis not present

## 2020-01-18 DIAGNOSIS — J040 Acute laryngitis: Secondary | ICD-10-CM | POA: Diagnosis not present

## 2020-01-18 DIAGNOSIS — E041 Nontoxic single thyroid nodule: Secondary | ICD-10-CM | POA: Diagnosis not present

## 2020-01-18 DIAGNOSIS — Z23 Encounter for immunization: Secondary | ICD-10-CM | POA: Diagnosis not present

## 2020-03-28 DIAGNOSIS — M5412 Radiculopathy, cervical region: Secondary | ICD-10-CM | POA: Diagnosis not present

## 2020-03-28 DIAGNOSIS — E559 Vitamin D deficiency, unspecified: Secondary | ICD-10-CM | POA: Diagnosis not present

## 2020-03-28 DIAGNOSIS — G459 Transient cerebral ischemic attack, unspecified: Secondary | ICD-10-CM | POA: Diagnosis not present

## 2020-04-03 DIAGNOSIS — E559 Vitamin D deficiency, unspecified: Secondary | ICD-10-CM | POA: Diagnosis not present

## 2020-04-22 ENCOUNTER — Ambulatory Visit
Admission: RE | Admit: 2020-04-22 | Discharge: 2020-04-22 | Disposition: A | Discharge status: Home / Self Care | Payer: BC Managed Care – PPO | Source: Ambulatory Visit | Attending: Internal Medicine | Admitting: Internal Medicine

## 2020-04-22 ENCOUNTER — Other Ambulatory Visit: Payer: Self-pay | Admitting: Internal Medicine

## 2020-04-22 DIAGNOSIS — M4722 Other spondylosis with radiculopathy, cervical region: Secondary | ICD-10-CM

## 2020-04-22 DIAGNOSIS — D1722 Benign lipomatous neoplasm of skin and subcutaneous tissue of left arm: Secondary | ICD-10-CM | POA: Diagnosis not present

## 2020-04-22 DIAGNOSIS — M5412 Radiculopathy, cervical region: Secondary | ICD-10-CM | POA: Diagnosis not present

## 2020-04-22 DIAGNOSIS — M542 Cervicalgia: Secondary | ICD-10-CM | POA: Diagnosis not present

## 2020-04-30 DIAGNOSIS — M542 Cervicalgia: Secondary | ICD-10-CM | POA: Diagnosis not present

## 2020-04-30 DIAGNOSIS — M5032 Other cervical disc degeneration, mid-cervical region, unspecified level: Secondary | ICD-10-CM | POA: Diagnosis not present

## 2020-04-30 DIAGNOSIS — M5412 Radiculopathy, cervical region: Secondary | ICD-10-CM | POA: Diagnosis not present

## 2020-05-05 DIAGNOSIS — M542 Cervicalgia: Secondary | ICD-10-CM | POA: Diagnosis not present

## 2020-05-08 DIAGNOSIS — M5412 Radiculopathy, cervical region: Secondary | ICD-10-CM | POA: Diagnosis not present

## 2020-05-12 ENCOUNTER — Other Ambulatory Visit: Payer: Self-pay | Admitting: Internal Medicine

## 2020-05-12 DIAGNOSIS — Z1231 Encounter for screening mammogram for malignant neoplasm of breast: Secondary | ICD-10-CM

## 2020-05-19 DIAGNOSIS — M5412 Radiculopathy, cervical region: Secondary | ICD-10-CM | POA: Diagnosis not present

## 2020-05-19 DIAGNOSIS — M542 Cervicalgia: Secondary | ICD-10-CM | POA: Diagnosis not present

## 2020-05-19 DIAGNOSIS — M47892 Other spondylosis, cervical region: Secondary | ICD-10-CM | POA: Diagnosis not present

## 2020-06-25 ENCOUNTER — Ambulatory Visit: Payer: BC Managed Care – PPO

## 2020-06-26 ENCOUNTER — Ambulatory Visit: Payer: Self-pay | Admitting: Adult Health

## 2020-07-03 ENCOUNTER — Ambulatory Visit: Payer: BC Managed Care – PPO | Admitting: Adult Health

## 2021-05-15 IMAGING — CT CT HEAD W/O CM
3 series · 16 of 47 positions shown, 19 images · non-contrast
Comparison: 12/10/2016

CLINICAL DATA: Headache

EXAM:
CT HEAD WITHOUT CONTRAST
TECHNIQUE: Contiguous axial images were obtained from the base of the skull
through the vertex without intravenous contrast.

[Series 3: head 5.0 h30s · axial · 0.43mm/px · z∈[-139,-9]mm · 10 of 32 slices shown, 13 images]
[im 3/32  brain]
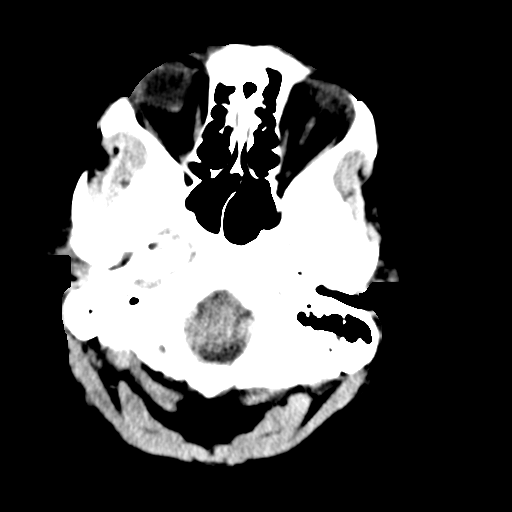
[im 3/32  bone]
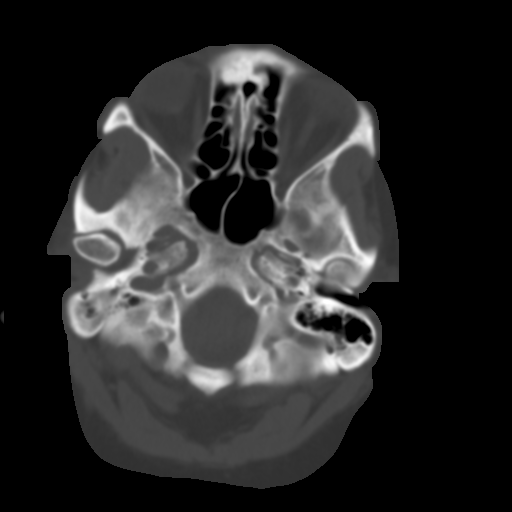
[im 6/32  brain]
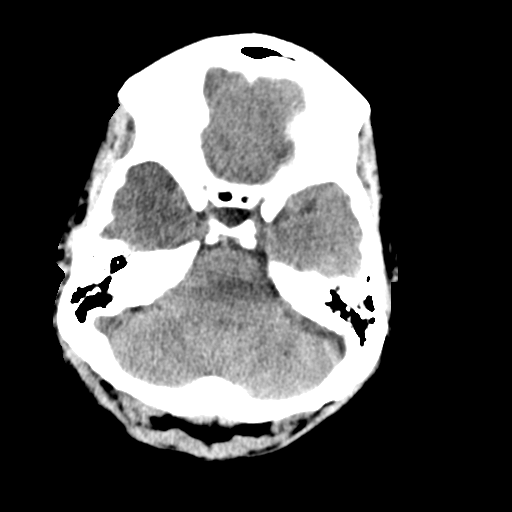
[im 9/32  brain]
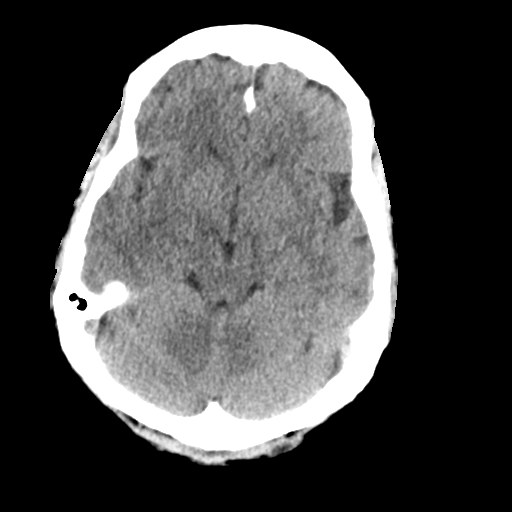
[im 11/32  brain]
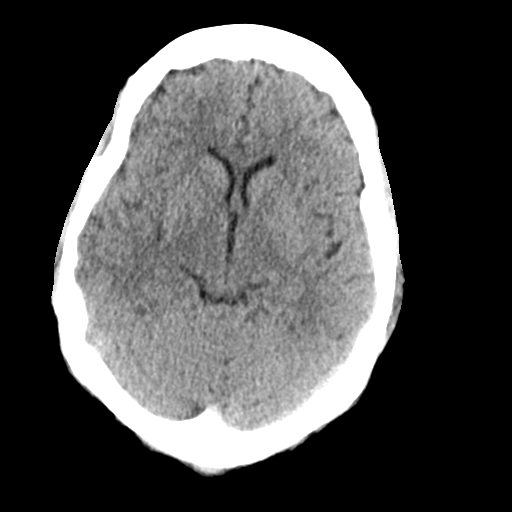
[im 14/32  brain]
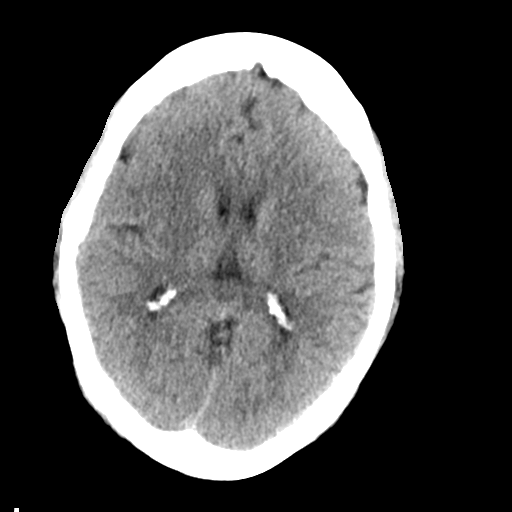
[im 14/32  bone]
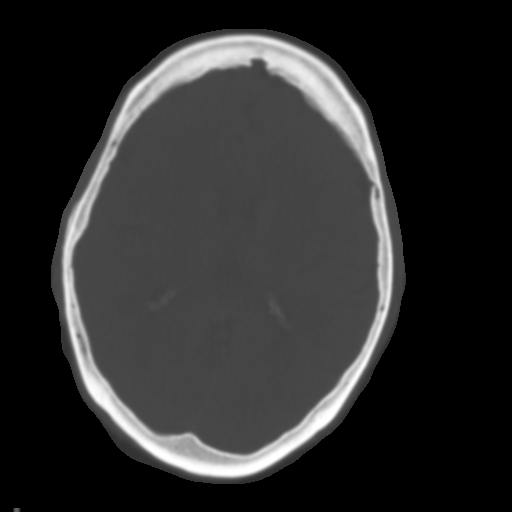
[im 18/32  brain]
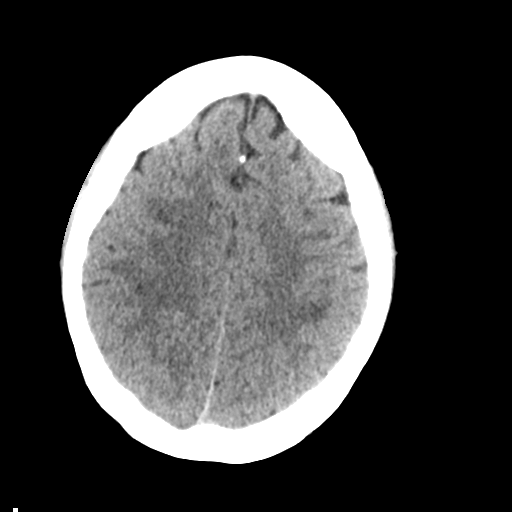
[im 21/32  brain]
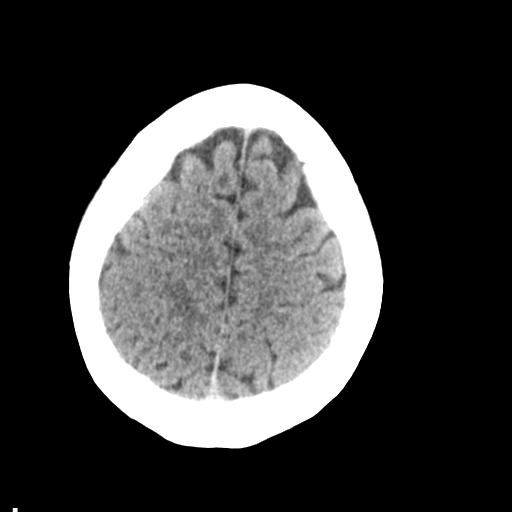
[im 24/32  brain]
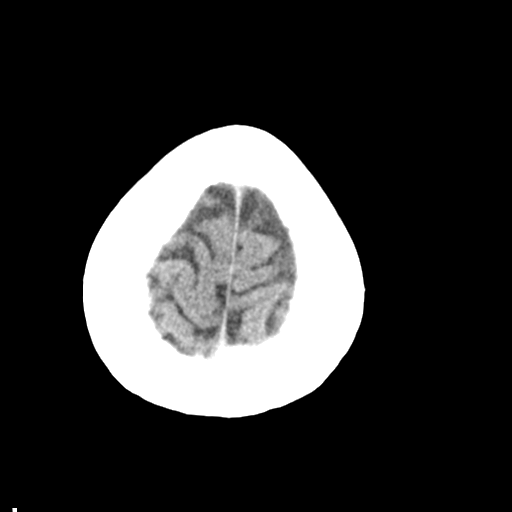
[im 26/32  brain]
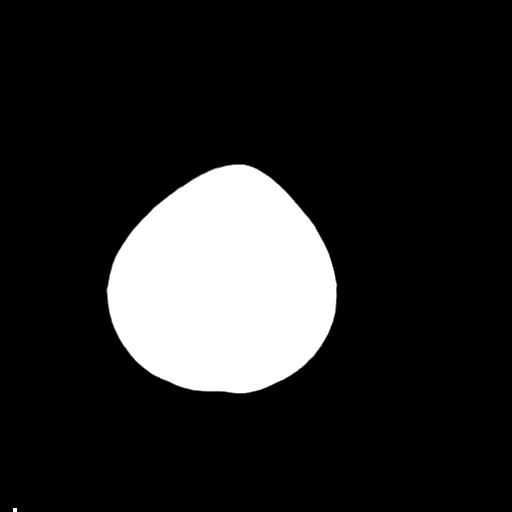
[im 26/32  bone]
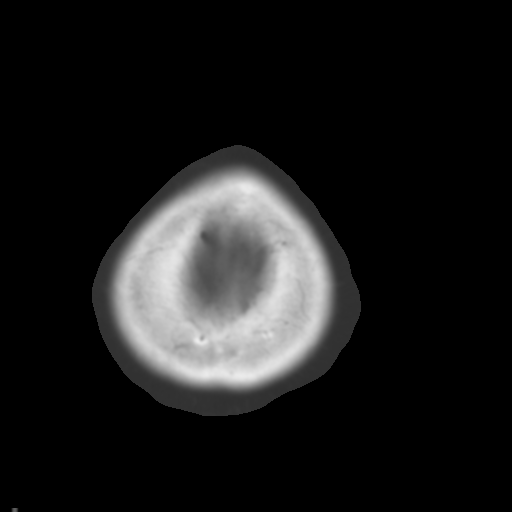
[im 29/32  brain]
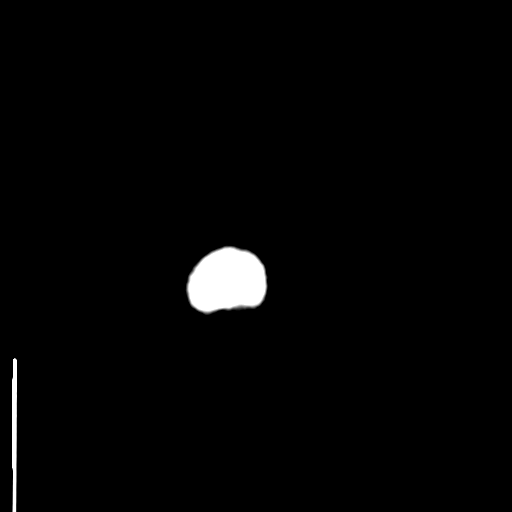

[Series 5: head 3.0 mpr cor · coronal · 0.32mm/px · 3 of 67 slices shown]
[im 23/67  brain]
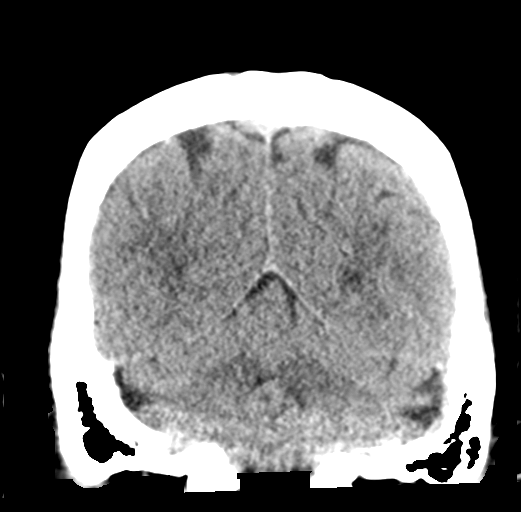
[im 30/67  brain]
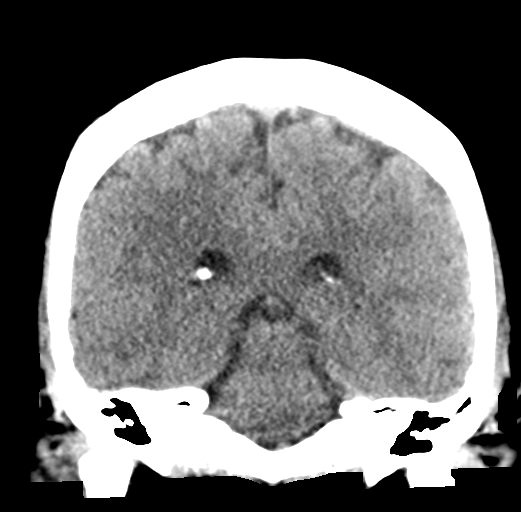
[im 37/67  brain]
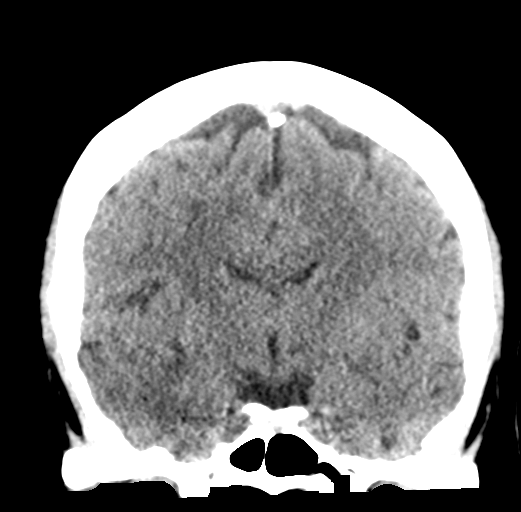

[Series 6: head 3.0 mpr sag · sagittal · 0.31mm/px · 3 of 62 slices shown]
[im 21/62  brain]
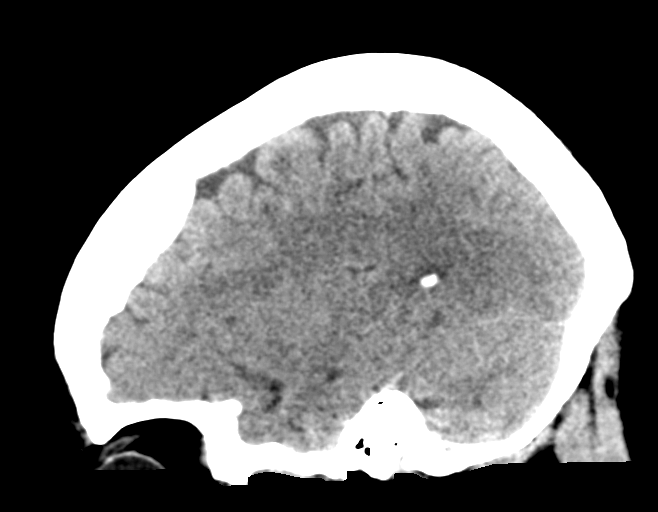
[im 31/62  brain]
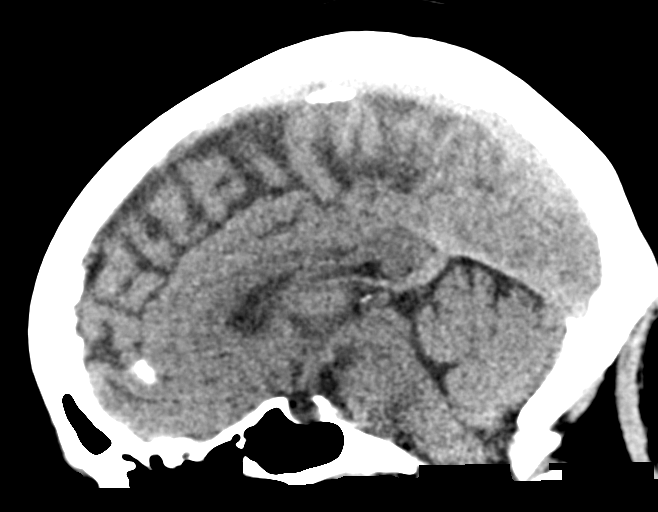
[im 41/62  brain]
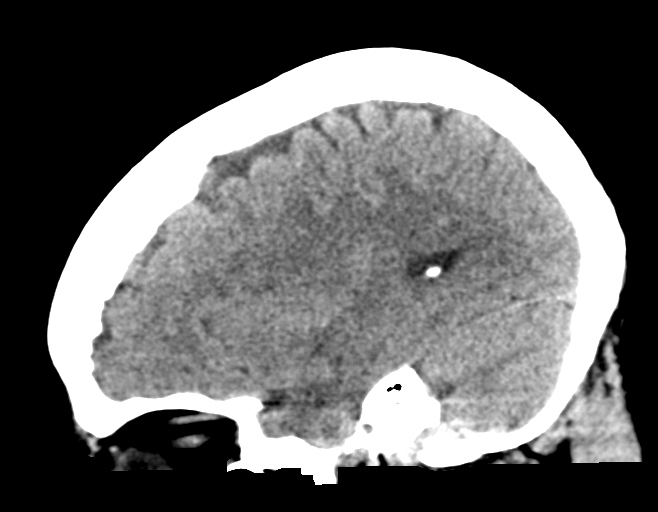

[16 of 47 positions shown; findings below may reference images not displayed]

FINDINGS: Brain: Chronic microvascular disease throughout the deep white
matter. No acute intracranial abnormality. Specifically, no
hemorrhage, hydrocephalus, mass lesion, acute infarction, or
significant intracranial injury.

Vascular: No hyperdense vessel or unexpected calcification.

Skull: No acute calvarial abnormality.

Sinuses/Orbits: Visualized paranasal sinuses and mastoids clear.
Orbital soft tissues unremarkable.

Other: None
IMPRESSION: Chronic small vessel disease throughout the deep white matter. No
acute intracranial abnormality.
